# Patient Record
Sex: Female | Born: 1960 | Race: Black or African American | Hispanic: No | Marital: Married | Smoking: Never smoker
Health system: Southern US, Community
[De-identification: ages and names within clinical notes are randomized; demographics above are authoritative.]

## PROBLEM LIST (undated history)

## (undated) DIAGNOSIS — E119 Type 2 diabetes mellitus without complications: Secondary | ICD-10-CM

## (undated) DIAGNOSIS — I10 Essential (primary) hypertension: Secondary | ICD-10-CM

## (undated) DIAGNOSIS — E785 Hyperlipidemia, unspecified: Secondary | ICD-10-CM

## (undated) HISTORY — DX: Essential (primary) hypertension: I10

## (undated) HISTORY — DX: Type 2 diabetes mellitus without complications: E11.9

## (undated) HISTORY — DX: Hyperlipidemia, unspecified: E78.5

---

## 1998-02-01 ENCOUNTER — Emergency Department (HOSPITAL_COMMUNITY): Admission: EM | Admit: 1998-02-01 | Discharge: 1998-02-01 | Payer: Self-pay | Admitting: Emergency Medicine

## 1998-02-02 ENCOUNTER — Emergency Department (HOSPITAL_COMMUNITY): Admission: EM | Admit: 1998-02-02 | Discharge: 1998-02-02 | Payer: Self-pay | Admitting: Emergency Medicine

## 1998-03-12 ENCOUNTER — Encounter: Admission: RE | Admit: 1998-03-12 | Discharge: 1998-03-12 | Payer: Self-pay | Admitting: Family Medicine

## 1998-03-31 ENCOUNTER — Other Ambulatory Visit: Admission: RE | Admit: 1998-03-31 | Discharge: 1998-03-31 | Payer: Self-pay | Admitting: *Deleted

## 1998-03-31 ENCOUNTER — Encounter: Admission: RE | Admit: 1998-03-31 | Discharge: 1998-03-31 | Payer: Self-pay | Admitting: Sports Medicine

## 1998-12-21 ENCOUNTER — Other Ambulatory Visit: Admission: RE | Admit: 1998-12-21 | Discharge: 1998-12-21 | Payer: Self-pay | Admitting: Internal Medicine

## 1999-01-17 ENCOUNTER — Emergency Department (HOSPITAL_COMMUNITY): Admission: EM | Admit: 1999-01-17 | Discharge: 1999-01-17 | Payer: Self-pay | Admitting: Emergency Medicine

## 1999-02-04 ENCOUNTER — Encounter: Payer: Self-pay | Admitting: Emergency Medicine

## 1999-02-04 ENCOUNTER — Emergency Department (HOSPITAL_COMMUNITY): Admission: EM | Admit: 1999-02-04 | Discharge: 1999-02-04 | Payer: Self-pay | Admitting: Emergency Medicine

## 1999-02-17 ENCOUNTER — Encounter: Payer: Self-pay | Admitting: Internal Medicine

## 1999-02-17 ENCOUNTER — Encounter: Admission: RE | Admit: 1999-02-17 | Discharge: 1999-02-17 | Payer: Self-pay | Admitting: Internal Medicine

## 2003-07-30 ENCOUNTER — Emergency Department (HOSPITAL_COMMUNITY): Admission: EM | Admit: 2003-07-30 | Discharge: 2003-07-30 | Payer: Self-pay

## 2006-08-15 ENCOUNTER — Encounter (INDEPENDENT_AMBULATORY_CARE_PROVIDER_SITE_OTHER): Payer: Self-pay | Admitting: Obstetrics and Gynecology

## 2006-08-15 ENCOUNTER — Ambulatory Visit (HOSPITAL_COMMUNITY): Admission: RE | Admit: 2006-08-15 | Discharge: 2006-08-16 | Payer: Self-pay | Admitting: Obstetrics and Gynecology

## 2007-09-11 ENCOUNTER — Encounter: Admission: RE | Admit: 2007-09-11 | Discharge: 2007-09-11 | Payer: Self-pay | Admitting: Obstetrics and Gynecology

## 2007-09-18 ENCOUNTER — Encounter: Admission: RE | Admit: 2007-09-18 | Discharge: 2007-09-18 | Payer: Self-pay | Admitting: Obstetrics and Gynecology

## 2010-06-08 NOTE — H&P (Signed)
[name], [name]                ACCOUNT NO.:  [id number]   MEDICAL RECORD NO.:  [id number]          PATIENT TYPE:  AMB   LOCATION:  SDC                           FACILITY:  [facility]   PHYSICIAN:  Sherron Monday, MD        DATE OF BIRTH:  [birth year]   DATE OF ADMISSION:  08/15/2006  DATE OF DISCHARGE:                              HISTORY & PHYSICAL   ADMISSION DIAGNOSES:  1. Pelvic pressure.  2. Uterine fibroids.   PROCEDURE PLANNED:  Laparoscopic supracervical hysterectomy versus total  abdominal hysterectomy.   HISTORY OF PRESENT ILLNESS:  A 50 year old female with complaints of  pelvic pressure and pain.  She also complains of heavy periods.   PAST MEDICAL HISTORY:  Significant for anemia.   PAST SURGICAL HISTORY:  Not significant.   PAST OB/GYN HISTORY:  She is a G6, P5-0-1-5, with G1 being a female  infant, full-term vaginal delivery.  G2, a preterm vaginal delivery of a  female infant.  He was  delivered 3 months early with no prenatal care.  G3 is a therapeutic abortion.  G4, a term vaginal delivery of a female  infant.  G5, term vaginal delivery of a female infant.  G6, term vaginal  delivery of a female infant and a bilateral tubal ligation.  She has no  history of any abnormal Pap smears, with the most recent of which being  on June 27, 2006, which was within normal limits.  She has no history of  any sexually transmitted diseases.  She is sexually active and  monogamous with a single female partner.  Her periods are heavy and every  2-4 weeks.  She recently had an endometrial biopsy, which revealed no  hyperplasia or malignancy.  She has some dyspareunia similar to her  pressure.  She has intermenstrual bleeding and occasional postcoital  bleeding.   MEDICATIONS:  Iron.   ALLERGIES:  She has no known drug allergies.   SOCIAL HISTORY:  She denies tobacco use.  Occasional alcohol use and  occasional marijuana use.  She is married.   FAMILY HISTORY:  Significant for  hypertension in her mother, sister,  brother and father.  Significant for diabetes in the father, sister and  brother.  Breast cancer in an aunt.  No colon or ovarian cancer.  She  was due when she presented to our office for a mammogram and we have yet  to get the result of that.   PHYSICAL EXAMINATION:  She is 5 feet 6 inches tall, weighs 187 pounds.  Blood pressure was 140/80.  Hemoglobin was 9.  GENERAL:  She is in no apparent distress.  CARDIOVASCULAR:  Regular rate and rhythm.  LUNGS:  Clear to auscultation bilaterally.  ABDOMEN:  Soft, obese, nontender, nondistended, with good bowel sounds.  EXTREMITIES:  Symmetric and nontender.  BREASTS:  D-cup.  No masses, nontender, no distortion.  NECK:  No lymphadenopathy.  Thyroid within normal limits.  HEENT:  Mucous membranes are moist.  BACK:  No costovertebral angle tenderness.  PELVIC:  Normal external female genitalia.  Normal BUS.  Good support.  Cervix and vagina without lesions.  No cervical motion tenderness.  Fourteen to 16-week size uterus with fibroids.  Normal adnexa.  There  were no masses and nontender.   At her initial visit she was diagnosed with both bacterial vaginitis and  Trichomonas.  She was treated with Flagyl.  She came in to discuss  surgery and had a hemoglobin check at that time of 8.6.  She was  encouraged to take her iron twice daily.  We discussed with the patient  the risks, benefits, and alternatives of surgery, including bleeding,  infection, damage to the surrounding organs, as well as laparoscopic  approach versus total abdominal hysterectomy approach.  The patient  voiced understanding and wishes to proceed with a laparoscopic approach  to a hysterectomy, knowing if supracervical she will need to continue  having Pap smears and knowing also that we might have to convert to an  abdominal hysterectomy.  She voices understanding of all this and we  will proceed with surgery on the 22nd.      Sherron Monday, MD  Electronically Signed     JB/MEDQ  D:  08/14/2006  T:  08/14/2006  Job:  [id number]

## 2010-06-08 NOTE — Op Note (Signed)
NAME:  [name], [name]                ACCOUNT NO.:  [id number]   MEDICAL RECORD NO.:  [id number]          PATIENT TYPE:  OIB   LOCATION:  9315                          FACILITY:  [facility]   PHYSICIAN:  Sherron Monday, MD        DATE OF BIRTH:  10/27/1960   DATE OF PROCEDURE:  08/15/2006  DATE OF DISCHARGE:                               OPERATIVE REPORT   PREOPERATIVE DIAGNOSIS:  Uterine fibroids, pelvic pain.   POSTOPERATIVE DIAGNOSIS:  Uterine fibroids, pelvic pain.   PROCEDURE:  Laparoscopic supracervical hysterectomy.   SURGEON:  Sherron Monday, M.D.   ASSISTANT:  Zenaida Niece, M.D.   ANESTHESIA:  General and 13 mL of 0.25% Marcaine.   SPECIMEN:  Morcellated uterus.   ESTIMATED BLOOD LOSS:  900 mL.   IV FLUIDS:  4800 mL.   URINE OUTPUT:  825 mL of clear urine.   COMPLICATIONS:  Bowel serosal injury.   DISPOSITION:  Stable to PACU.   PROCEDURE IN DETAIL:  After informed consent was reviewed with the  patient including the risks, benefits, and alternatives of  the surgical  procedure, she was transported to the operating room and placed on the  table in the supine position.  General anesthesia was induced and found  to be adequate.  She was then placed in the yellow thin stirrups,  prepped and draped in the normal sterile fashion.  A Foley catheter was  sterilely placed.  The gloves were changed and the abdominal portion of  the case was started with a 5 mm infraumbilical incision made. Using the  Veress needle, a pneumoperitoneum was obtained.  The Veress needle  placement was checked with several tests including the hanging drop  test. The pneumoperitoneum was obtained and with direct entry, a 5 mm  port was placed.  A brief pelvic survey was performed revealing a 14 to  16 weeks size obviously fibroid uterus, normal tubes and ovaries  bilaterally.   Attention was then turned to putting in the accessory ports.  Approximately at the level of the umbilicus on the right side,  a 5 mL  infraumbilical port was placed after careful visualization of the  epigastric vessels.  A blunt probe was used to survey the pelvis  further. Attention was then turned to putting the left accessory port in  and a 10 mm port was placed after visualization of the epigastric  vessels. The 5 mm tenaculum was placed in the right port and the uterus  was grasped at the cornua. On the left side, the adnexa was easily taken  down hemostatically, the round ligament and the fallopian tubes and  mesosalpinx.  The uterine arteries were skeletonized and then ligated  and made hemostatic.  The bladder flap was then created.  A similar  procedure was performed on the right side to the level of the uterine  artery.  The cervix was then drilled to excise the uterus.  The  endometrial canal into the cervix was then fulgurated with a harmonic  scalpel.  The uterus was morcellated, weighing 600+ grams.  The  fragments  of uterus were also removed.  In morcellation of the uterus,  the bowel serosa was inadvertently grasped and but found to be only  mildly injured and hemostatic. Interceed was placed at the level of the  cervix. Again, a survey was performed to reveal hemostasis.  The ports  were removed.  The fascia was closed with the 10 mm port and a deep  suture of 0 Vicryl was also placed.  The skin at that incision was  closed with 3-0 Vicryl.  The skin of the additional ports was closed  with Dermabond.  The patient tolerated the procedure well.  Sponge, lap  and needle counts were correct x2 at the end of the procedure.      Sherron Monday, MD  Electronically Signed     JB/MEDQ  D:  08/15/2006  T:  08/15/2006  Job:  [id number]

## 2010-06-08 NOTE — Discharge Summary (Signed)
NAMEBRANTLEIGH, MIFFLIN                ACCOUNT NO.:  [id number]   MEDICAL RECORD NO.:  [id number]          PATIENT TYPE:  OIB   LOCATION:  9315                          [facility]ACILITY:  [facility]   PHYSICIAN:  Sherron Monday, MD        DATE OF BIRTH:  09-18-1960   DATE OF ADMISSION:  08/15/2006  DATE OF DISCHARGE:  08/16/2006                               DISCHARGE SUMMARY   REASON FOR ADMISSION:  She was admitted for observation following a  laparoscopic supracervical hysterectomy with an EBL of 900 mL.   HISTORY OF PRESENT ILLNESS:  This is a 50 year old female with  complaints of pelvic pressure and pain with menorrhagia.   PAST MEDICAL HISTORY:  Significant for anemia.   PAST SURGICAL HISTORY:  Nonsignificant.   PAST OB/GYN HISTORY:  G6, P5, 0, 1, 5 with a recent endometrial biopsy  that was benign as well as a pap smear that was also benign.  No history  of any abnormal pap smear or sexually transmitted diseases.   MEDICATIONS:  Iron.   ALLERGIES:  No known drug allergies.   SOCIAL HISTORY:  Denies tobacco use, occasional alcohol use, occasional  marijuana use.  She is married.   FAMILY HISTORY:  Significant for hypertension as well as diabetes as  well as breast cancer.   PHYSICAL EXAMINATION:  Benign.  On admission she underwent her  hysterectomy on August 15, 2006 with complication but an EBL of 900 mL.  Her postoperative course was relatively uncomplicated.  She was  discharged to home on postoperative day #1.  Her hemoglobin had  decreased from 10.7 preop to 8.9 immediately postop to 7.1 on postop day  #1.  She is advised of this and advised to do iron.  She also showed a  benign exam at this time.  She will be discharged to home with routine  discharge instructions and numbers to call with any questions or  problems.  She will follow up in 2-3 weeks.  She has a prescription for  Motrin and Vicodin and is to continue her iron 1 or 2 times a day.      Sherron Monday, MD  Electronically Signed     JB/MEDQ  D:  08/16/2006  T:  08/16/2006  Job:  [id number]

## 2010-11-08 LAB — DIFFERENTIAL
Basophils Absolute: 0
Basophils Relative: 0
Eosinophils Relative: 3
Monocytes Absolute: 0.4
Neutro Abs: 2.4

## 2010-11-08 LAB — BASIC METABOLIC PANEL
BUN: 7
BUN: <1 — ABNORMAL LOW
CO2: 28
Calcium: 8.9
Chloride: 107
Glucose, Bld: 106 — ABNORMAL HIGH
Glucose, Bld: 98
Potassium: 3.9
Sodium: 137

## 2010-11-08 LAB — CBC
HCT: 21.9 — ABNORMAL LOW
Hemoglobin: 10.7 — ABNORMAL LOW
MCHC: 32.9
MCV: 85
Platelets: 257
Platelets: 330
RDW: 20.1 — ABNORMAL HIGH
RDW: 20.2 — ABNORMAL HIGH

## 2010-11-08 LAB — HEMOGLOBIN AND HEMATOCRIT, BLOOD: Hemoglobin: 8.9 — ABNORMAL LOW

## 2010-11-08 LAB — SAMPLE TO BLOOD BANK

## 2012-01-27 ENCOUNTER — Encounter (HOSPITAL_COMMUNITY): Payer: Self-pay | Admitting: Emergency Medicine

## 2012-01-27 ENCOUNTER — Emergency Department (INDEPENDENT_AMBULATORY_CARE_PROVIDER_SITE_OTHER)
Admission: EM | Admit: 2012-01-27 | Discharge: 2012-01-27 | Disposition: A | Discharge status: Home / Self Care | Payer: Self-pay | Source: Home / Self Care

## 2012-01-27 DIAGNOSIS — IMO0002 Reserved for concepts with insufficient information to code with codable children: Secondary | ICD-10-CM

## 2012-01-27 DIAGNOSIS — S46219A Strain of muscle, fascia and tendon of other parts of biceps, unspecified arm, initial encounter: Secondary | ICD-10-CM

## 2012-01-27 DIAGNOSIS — S39012A Strain of muscle, fascia and tendon of lower back, initial encounter: Secondary | ICD-10-CM

## 2012-01-27 DIAGNOSIS — S43499A Other sprain of unspecified shoulder joint, initial encounter: Secondary | ICD-10-CM

## 2012-01-27 MED ORDER — NAPROXEN 500 MG PO TBEC: 500.0000 mg | DELAYED_RELEASE_TABLET | Freq: Two times a day (BID) | ORAL | Status: DC

## 2012-01-27 MED ORDER — TRAMADOL HCL 50 MG PO TABS: ORAL_TABLET | ORAL | Status: DC
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
## 2012-01-31 NOTE — ED Provider Notes (Signed)
Medical screening examination/treatment/procedure(s) were performed by resident physician or non-physician practitioner and as supervising physician I was immediately available for consultation/collaboration.   Barkley Bruns MD.    Linna Hoff, MD 01/31/12 1355

## 2013-10-01 ENCOUNTER — Other Ambulatory Visit: Payer: Self-pay

## 2013-10-01 DIAGNOSIS — Z1231 Encounter for screening mammogram for malignant neoplasm of breast: Secondary | ICD-10-CM

## 2013-10-15 ENCOUNTER — Ambulatory Visit
Admission: RE | Admit: 2013-10-15 | Discharge: 2013-10-15 | Disposition: A | Discharge status: Home / Self Care | Payer: BC Managed Care – PPO | Source: Ambulatory Visit

## 2013-10-15 DIAGNOSIS — Z1231 Encounter for screening mammogram for malignant neoplasm of breast: Secondary | ICD-10-CM

## 2016-03-25 ENCOUNTER — Other Ambulatory Visit: Payer: Self-pay | Admitting: Nurse Practitioner

## 2016-03-25 DIAGNOSIS — Z1231 Encounter for screening mammogram for malignant neoplasm of breast: Secondary | ICD-10-CM

## 2016-04-19 ENCOUNTER — Ambulatory Visit
Admission: RE | Admit: 2016-04-19 | Discharge: 2016-04-19 | Disposition: A | Discharge status: Home / Self Care | Payer: BLUE CROSS/BLUE SHIELD | Source: Ambulatory Visit | Attending: Nurse Practitioner | Admitting: Nurse Practitioner

## 2016-04-19 DIAGNOSIS — Z1231 Encounter for screening mammogram for malignant neoplasm of breast: Secondary | ICD-10-CM

## 2017-11-20 ENCOUNTER — Other Ambulatory Visit: Payer: Self-pay | Admitting: Internal Medicine

## 2017-11-20 DIAGNOSIS — Z1231 Encounter for screening mammogram for malignant neoplasm of breast: Secondary | ICD-10-CM

## 2017-12-28 ENCOUNTER — Ambulatory Visit
Admission: RE | Admit: 2017-12-28 | Discharge: 2017-12-28 | Disposition: A | Discharge status: Home / Self Care | Payer: 59 | Source: Ambulatory Visit | Attending: Internal Medicine | Admitting: Internal Medicine

## 2017-12-28 DIAGNOSIS — Z1231 Encounter for screening mammogram for malignant neoplasm of breast: Secondary | ICD-10-CM

## 2019-05-01 ENCOUNTER — Other Ambulatory Visit: Payer: Self-pay | Admitting: Internal Medicine

## 2019-05-01 DIAGNOSIS — Z1231 Encounter for screening mammogram for malignant neoplasm of breast: Secondary | ICD-10-CM

## 2019-05-16 ENCOUNTER — Ambulatory Visit
Admission: RE | Admit: 2019-05-16 | Discharge: 2019-05-16 | Disposition: A | Discharge status: Home / Self Care | Payer: 59 | Source: Ambulatory Visit | Attending: Internal Medicine | Admitting: Internal Medicine

## 2019-05-16 ENCOUNTER — Other Ambulatory Visit: Payer: Self-pay

## 2019-05-16 DIAGNOSIS — Z1231 Encounter for screening mammogram for malignant neoplasm of breast: Secondary | ICD-10-CM

## 2019-05-17 ENCOUNTER — Other Ambulatory Visit: Payer: Self-pay | Admitting: Internal Medicine

## 2019-05-17 DIAGNOSIS — R928 Other abnormal and inconclusive findings on diagnostic imaging of breast: Secondary | ICD-10-CM

## 2019-05-29 ENCOUNTER — Other Ambulatory Visit: Payer: Self-pay

## 2019-05-29 ENCOUNTER — Ambulatory Visit
Admission: RE | Admit: 2019-05-29 | Discharge: 2019-05-29 | Disposition: A | Discharge status: Home / Self Care | Payer: 59 | Source: Ambulatory Visit | Attending: Internal Medicine | Admitting: Internal Medicine

## 2019-05-29 ENCOUNTER — Ambulatory Visit: Payer: 59

## 2019-05-29 DIAGNOSIS — R928 Other abnormal and inconclusive findings on diagnostic imaging of breast: Secondary | ICD-10-CM

## 2019-06-01 ENCOUNTER — Ambulatory Visit: Payer: 59 | Attending: Internal Medicine

## 2019-06-01 DIAGNOSIS — Z23 Encounter for immunization: Secondary | ICD-10-CM

## 2019-06-01 NOTE — Progress Notes (Signed)
   Covid-19 Vaccination Clinic  Name:  CLEMMIE BUELNA    MRN: [id number] DOB: June 25, 1960  06/01/2019  Ms. Simons was observed post Covid-19 immunization for 15 minutes without incident. She was provided with Vaccine Information Sheet and instruction to access the V-Safe system.   Ms. Biever was instructed to call 911 with any severe reactions post vaccine: Marland Kitchen Difficulty breathing  . Swelling of face and throat  . A fast heartbeat  . A bad rash all over body  . Dizziness and weakness   Immunizations Administered    Name Date Dose VIS Date Route   Moderna COVID-19 Vaccine 06/01/2019 12:03 PM 0.5 mL 12/2018 Intramuscular   Manufacturer: Moderna   Lot: [id number]   NDC: 89791-504-13

## 2019-07-03 ENCOUNTER — Ambulatory Visit: Payer: 59

## 2020-07-23 ENCOUNTER — Other Ambulatory Visit: Payer: Self-pay | Admitting: Internal Medicine

## 2020-07-23 DIAGNOSIS — Z1231 Encounter for screening mammogram for malignant neoplasm of breast: Secondary | ICD-10-CM

## 2020-09-16 ENCOUNTER — Other Ambulatory Visit: Payer: Self-pay

## 2020-09-16 ENCOUNTER — Ambulatory Visit
Admission: RE | Admit: 2020-09-16 | Discharge: 2020-09-16 | Disposition: A | Discharge status: Home / Self Care | Payer: 59 | Source: Ambulatory Visit | Attending: Internal Medicine | Admitting: Internal Medicine

## 2020-09-16 DIAGNOSIS — Z1231 Encounter for screening mammogram for malignant neoplasm of breast: Secondary | ICD-10-CM

## 2021-12-10 ENCOUNTER — Other Ambulatory Visit: Payer: Self-pay | Admitting: Internal Medicine

## 2021-12-10 DIAGNOSIS — Z1231 Encounter for screening mammogram for malignant neoplasm of breast: Secondary | ICD-10-CM

## 2021-12-15 ENCOUNTER — Ambulatory Visit
Admission: RE | Admit: 2021-12-15 | Discharge: 2021-12-15 | Disposition: A | Discharge status: Home / Self Care | Payer: BC Managed Care – PPO | Source: Ambulatory Visit | Attending: Internal Medicine | Admitting: Internal Medicine

## 2021-12-15 DIAGNOSIS — Z1231 Encounter for screening mammogram for malignant neoplasm of breast: Secondary | ICD-10-CM

## 2022-04-29 IMAGING — MG MM DIGITAL SCREENING BILAT W/ TOMO AND CAD
8 series · 8 of 24 positions shown · non-contrast
Comparison: Previous exam(s).

CLINICAL DATA: Screening.

EXAM:
DIGITAL SCREENING BILATERAL MAMMOGRAM WITH TOMOSYNTHESIS AND CAD
TECHNIQUE: Bilateral screening digital craniocaudal and mediolateral oblique
mammograms were obtained. Bilateral screening digital breast
tomosynthesis was performed. The images were evaluated with
computer-aided detection.

[L CC synth-2D]
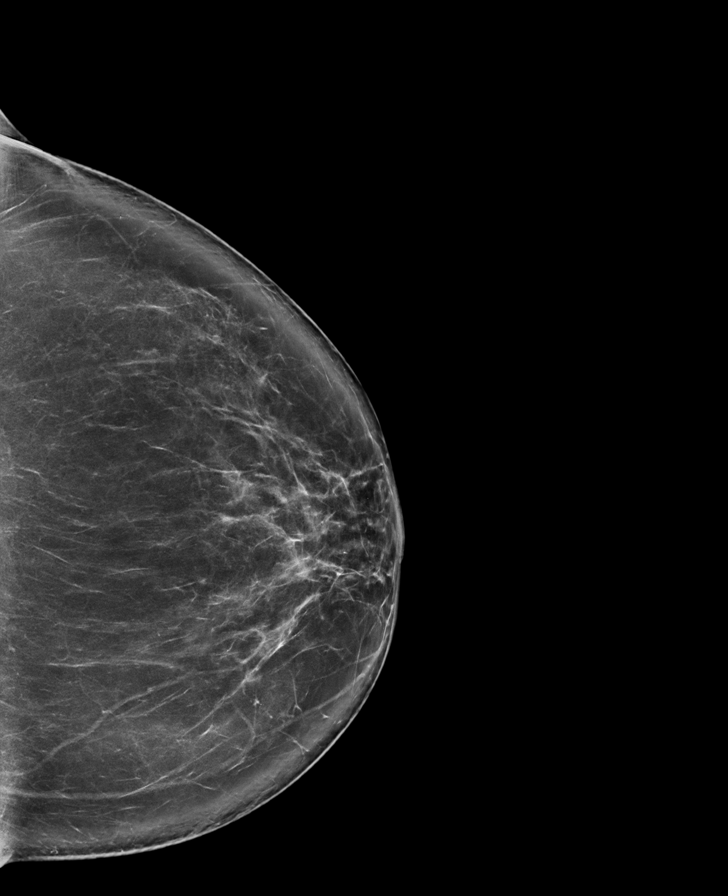

[R MLO synth-2D]
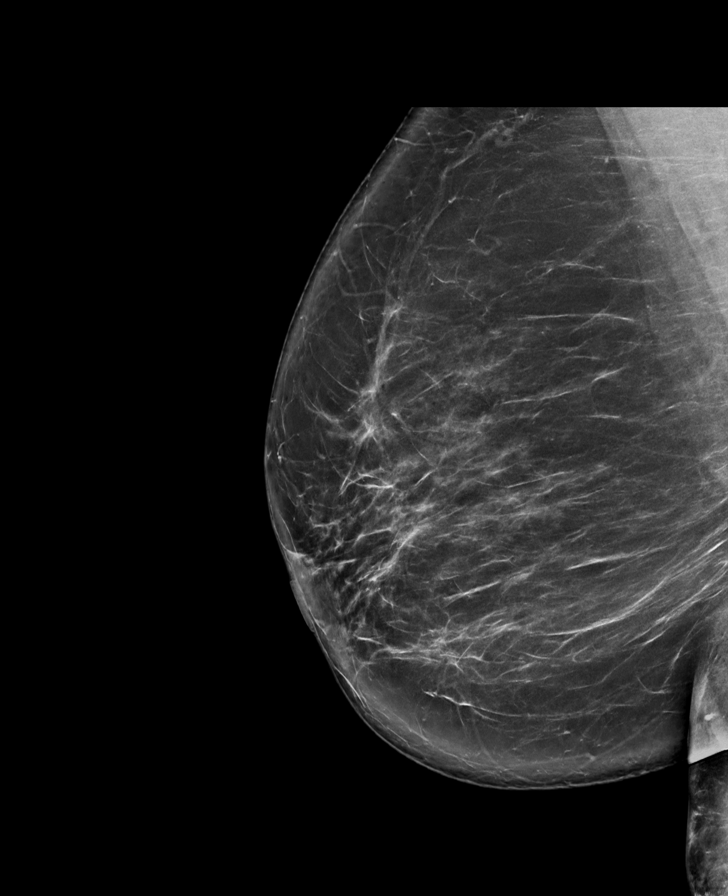

[L MLO synth-2D]
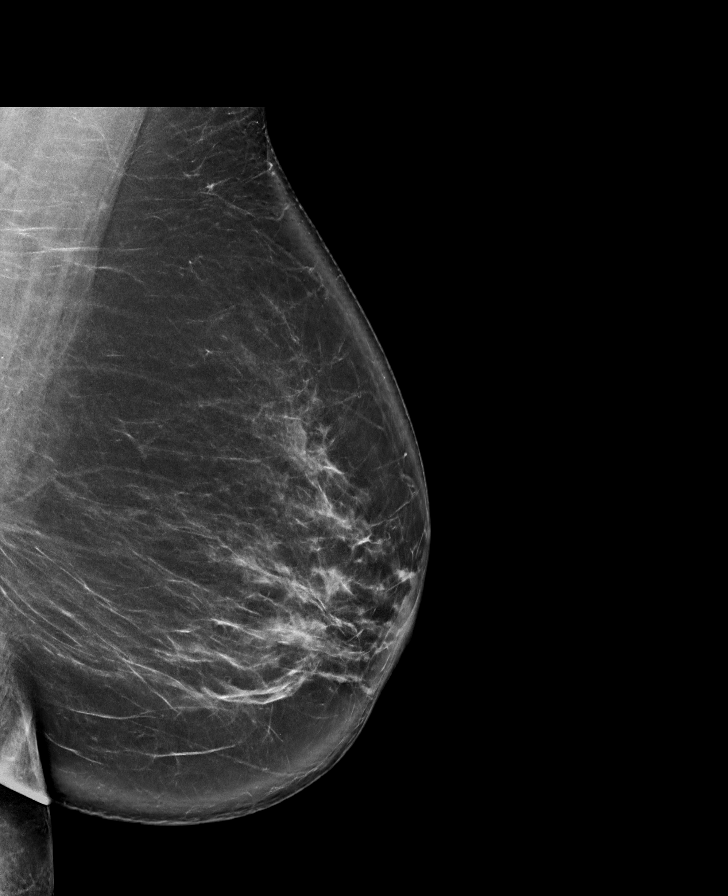

[R CC synth-2D]
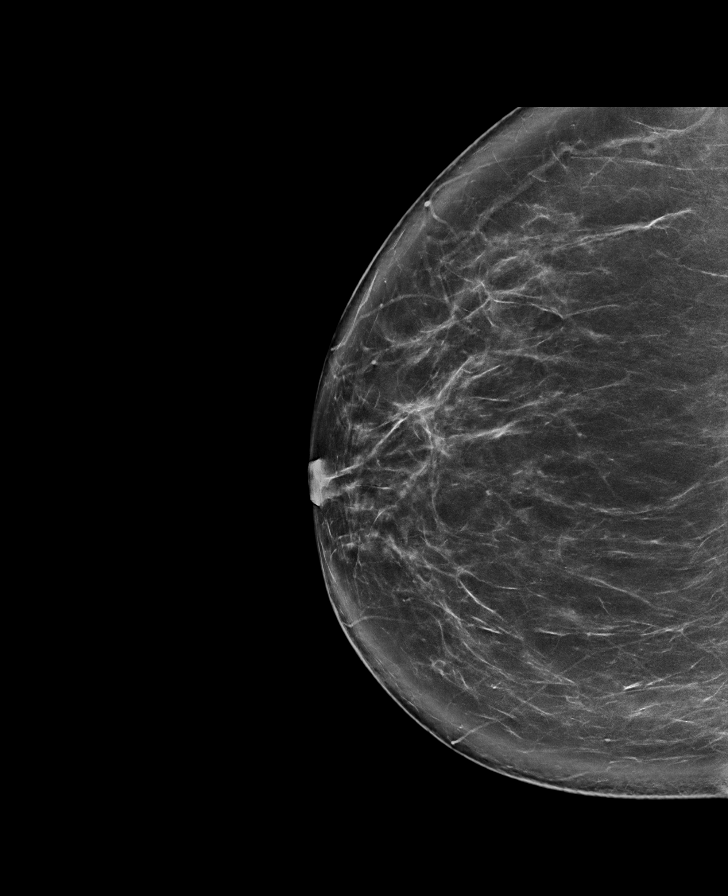

[L MLO tomo · tomo slice 51/101.0]
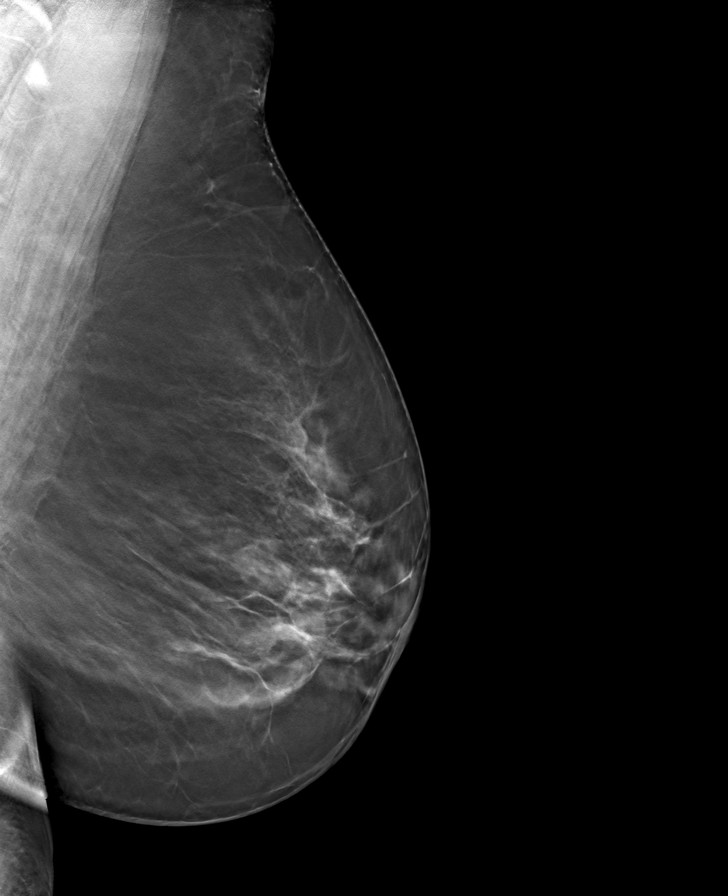

[L CC tomo · tomo slice 49/96.0]
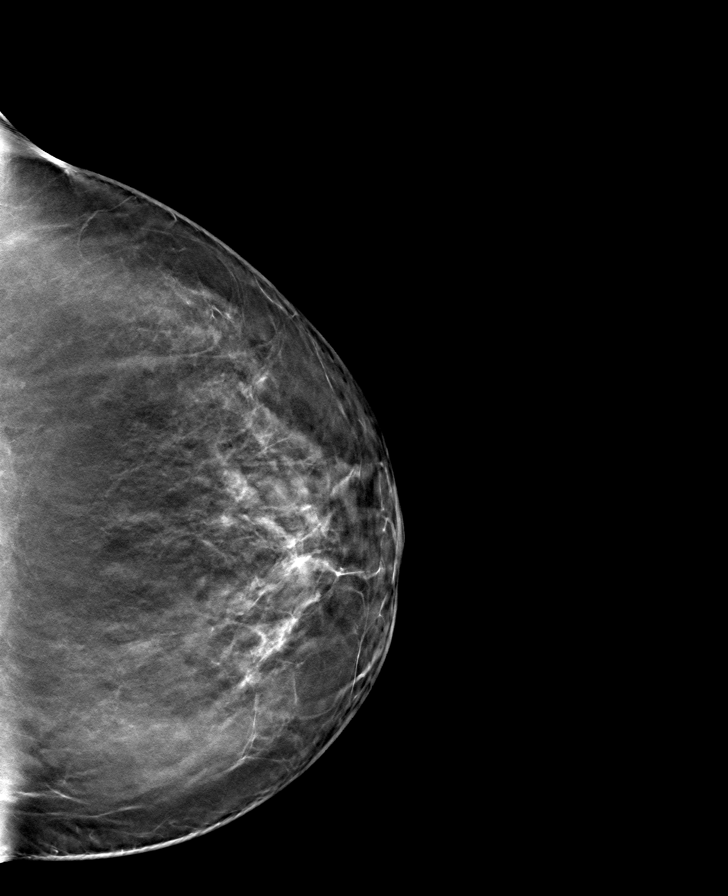

[R CC tomo · tomo slice 50/99.0]
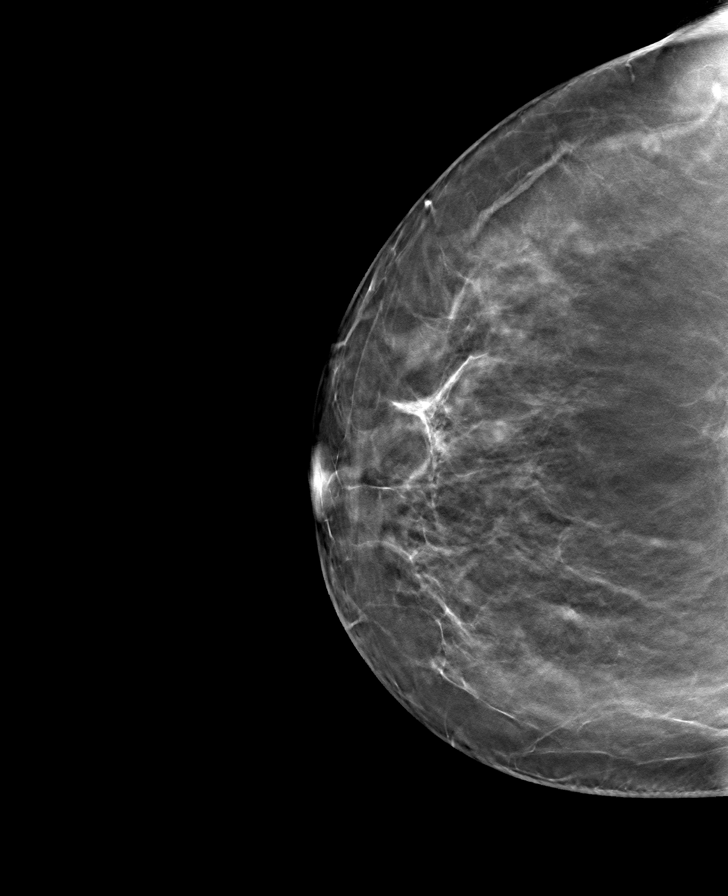

[R MLO tomo · tomo slice 53/104.0]
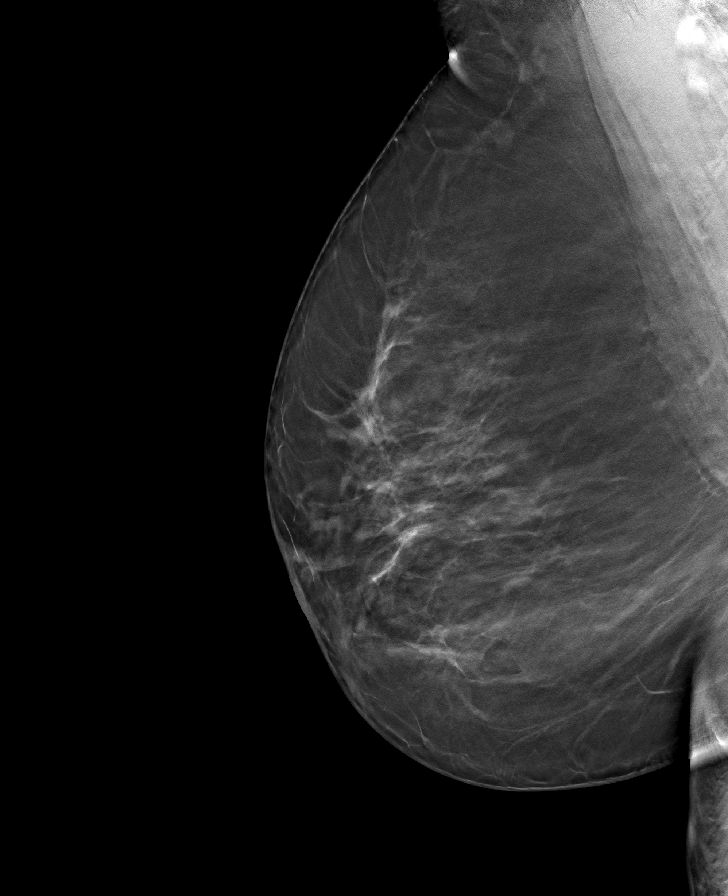

[8 of 24 positions shown; findings below may reference images not displayed]

ACR Breast Density Category b: There are scattered areas of
fibroglandular density.
FINDINGS: There are no findings suspicious for malignancy.
IMPRESSION: No mammographic evidence of malignancy. A result letter of this
screening mammogram will be mailed directly to the patient.

RECOMMENDATION:
Screening mammogram in one year. (Code:51-O-LD2)

BI-RADS CATEGORY  1: Negative.

## 2022-07-18 LAB — HM COLONOSCOPY

## 2022-07-19 NOTE — Progress Notes (Unsigned)
There were no vitals taken for this visit.   Subjective:    Patient ID: [name], female    DOB: 03/12/1960, 62 y.o.   MRN: [id number]  HPI: [name] is a 62 y.o. female  No chief complaint on file.  Establish care: her last physical was ***.  Medical history includes ***.  Family history includes ***.  Health maintenance ***.   Relevant past medical, surgical, family and social history reviewed and updated as indicated. Interim medical history since our last visit reviewed. Allergies and medications reviewed and updated.  Review of Systems  Constitutional: Negative for fever or weight change.  Respiratory: Negative for cough and shortness of breath.   Cardiovascular: Negative for chest pain or palpitations.  Gastrointestinal: Negative for abdominal pain, no bowel changes.  Musculoskeletal: Negative for gait problem or joint swelling.  Skin: Negative for rash.  Neurological: Negative for dizziness or headache.  No other specific complaints in a complete review of systems (except as listed in HPI above).      Objective:    There were no vitals taken for this visit.  Wt Readings from Last 3 Encounters:  No data found for Wt    Physical Exam  Constitutional: Patient appears well-developed and well-nourished. Obese *** No distress.  HEENT: head atraumatic, normocephalic, pupils equal and reactive to light, ears ***, neck supple, throat within normal limits Cardiovascular: Normal rate, regular rhythm and normal heart sounds.  No murmur heard. No BLE edema. Pulmonary/Chest: Effort normal and breath sounds normal. No respiratory distress. Abdominal: Soft.  There is no tenderness. Psychiatric: Patient has a normal mood and affect. behavior is normal. Judgment and thought content normal.   Results for orders placed or performed during the hospital encounter of 08/15/06  Basic metabolic panel  Result Value Ref Range   Sodium 137    Potassium 3.5    Chloride 104     CO2 28    Glucose, Bld 98    BUN 7    Creatinine, Ser 0.58    Calcium 8.9    GFR calc non Af Amer >60    GFR calc Af Amer      >60        The eGFR has been calculated using the MDRD equation. This calculation has not been validated in all clinical  CBC  Result Value Ref Range   WBC 4.3    RBC 3.86 (L)    Hemoglobin 10.7 (L)    HCT 32.4 (L)    MCV 84.1    MCHC 32.9    RDW 20.2 (H)    Platelets 330   Differential  Result Value Ref Range   Neutrophils Relative % 57    Neutro Abs 2.4    Lymphocytes Relative 30    Lymphs Abs 1.3    Monocytes Relative 10    Monocytes Absolute 0.4    Eosinophils Relative 3    Eosinophils Absolute 0.1    Basophils Relative 0    Basophils Absolute 0.0   Hemoglobin and hematocrit, blood  Result Value Ref Range   Hemoglobin 8.9 DELTA CHECK NOTED (L)    HCT 27.2 (L)   Basic metabolic panel  Result Value Ref Range   Sodium 140    Potassium 3.9    Chloride 107    CO2 29    Glucose, Bld 106 (H)    BUN <1 DELTA CHECK NOTED (L)    Creatinine, Ser 0.61    Calcium  8.4    GFR calc non Af Amer >60    GFR calc Af Amer      >60        The eGFR has been calculated using the MDRD equation. This calculation has not been validated in all clinical  CBC  Result Value Ref Range   WBC 6.9    RBC 2.57 (L)    Hemoglobin (LL)     7.1 DELTA CHECK NOTED CRITICAL RESULT CALLED TO, READ BACK BY AND VERIFIED WITH: MOUNCE, A AT 5784 ON 08/16/06 BY TATE J   HCT 21.9 (L)    MCV 85.0    MCHC 32.6    RDW 20.1 (H)    Platelets 257   Sample to Blood Bank  Result Value Ref Range   Blood Bank Specimen SAMPLE AVAILABLE FOR TESTING    Sample Expiration 08/17/2006       Assessment & Plan:   Problem List Items Addressed This Visit   None    Follow up plan: No follow-ups on file.

## 2022-07-21 ENCOUNTER — Encounter: Payer: Self-pay | Admitting: Nurse Practitioner

## 2022-07-21 ENCOUNTER — Ambulatory Visit: Payer: Medicaid Other | Admitting: Nurse Practitioner

## 2022-07-21 VITALS — BP 130/80 | HR 99 | Temp 98.0°F | Resp 16 | Ht 65.5 in | Wt 188.0 lb

## 2022-07-21 DIAGNOSIS — I10 Essential (primary) hypertension: Secondary | ICD-10-CM

## 2022-07-21 DIAGNOSIS — E782 Mixed hyperlipidemia: Secondary | ICD-10-CM | POA: Diagnosis not present

## 2022-07-21 DIAGNOSIS — Z1159 Encounter for screening for other viral diseases: Secondary | ICD-10-CM

## 2022-07-21 DIAGNOSIS — Z7689 Persons encountering health services in other specified circumstances: Secondary | ICD-10-CM

## 2022-07-21 DIAGNOSIS — Z114 Encounter for screening for human immunodeficiency virus [HIV]: Secondary | ICD-10-CM | POA: Diagnosis not present

## 2022-07-21 DIAGNOSIS — E1165 Type 2 diabetes mellitus with hyperglycemia: Secondary | ICD-10-CM

## 2022-07-21 DIAGNOSIS — Z7984 Long term (current) use of oral hypoglycemic drugs: Secondary | ICD-10-CM

## 2022-07-21 LAB — CBC WITH DIFFERENTIAL/PLATELET
Absolute Monocytes: 399 cells/uL (ref 200–950)
Basophils Absolute: 42 cells/uL (ref 0–200)
MCH: 27.5 pg (ref 27.0–33.0)
MPV: 9.8 fL (ref 7.5–12.5)
Neutro Abs: 1663 cells/uL (ref 1500–7800)
RBC: 4 10*6/uL (ref 3.80–5.10)

## 2022-07-21 NOTE — Assessment & Plan Note (Signed)
Continue atorvastatin 20mg daily

## 2022-07-21 NOTE — Assessment & Plan Note (Deleted)
Continue taking metformin 500 mg daily, will get labs today 

## 2022-07-21 NOTE — Assessment & Plan Note (Signed)
Continue taking metformin 500 mg daily, will get labs today

## 2022-07-21 NOTE — Assessment & Plan Note (Signed)
Recommend being consistent with blood pressure medication.  Take lisinopril-hydrochlorothiazide daily

## 2022-07-22 ENCOUNTER — Other Ambulatory Visit: Payer: Self-pay | Admitting: Nurse Practitioner

## 2022-07-22 DIAGNOSIS — D649 Anemia, unspecified: Secondary | ICD-10-CM

## 2022-07-22 LAB — MICROALBUMIN / CREATININE URINE RATIO
Microalb Creat Ratio: 6 mg/g creat (ref <30)
Microalb, Ur: 0.4 mg/dL

## 2022-07-22 LAB — COMPLETE METABOLIC PANEL WITH GFR
AG Ratio: 1.3 (calc) (ref 1.0–2.5)
ALT: 16 U/L (ref 6–29)
BUN: 17 mg/dL (ref 7–25)
CO2: 27 mmol/L (ref 20–32)
Calcium: 9.6 mg/dL (ref 8.6–10.4)
Sodium: 140 mmol/L (ref 135–146)
Total Bilirubin: 0.3 mg/dL (ref 0.2–1.2)
eGFR: 96 mL/min/{1.73_m2} (ref >=60)

## 2022-07-22 LAB — LIPID PANEL
Non-HDL Cholesterol (Calc): 94 mg/dL (calc) (ref <130)
Total CHOL/HDL Ratio: 2.5 (calc) (ref <5.0)

## 2022-07-22 LAB — CBC WITH DIFFERENTIAL/PLATELET
Basophils Relative: 1 %
Eosinophils Absolute: 210 cells/uL (ref 15–500)
Eosinophils Relative: 5 %
HCT: 34.5 % — ABNORMAL LOW (ref 35.0–45.0)
Lymphs Abs: 1886 cells/uL (ref 850–3900)
Monocytes Relative: 9.5 %
Platelets: 329 10*3/uL (ref 140–400)
RDW: 13.3 % (ref 11.0–15.0)
WBC: 4.2 10*3/uL (ref 3.8–10.8)

## 2022-07-22 LAB — HEPATITIS C ANTIBODY: Hepatitis C Ab: NONREACTIVE

## 2022-07-22 LAB — HIV ANTIBODY (ROUTINE TESTING W REFLEX): HIV 1&2 Ab, 4th Generation: NONREACTIVE

## 2022-07-24 LAB — IRON,TIBC AND FERRITIN PANEL
%SAT: 13 % (calc) — ABNORMAL LOW (ref 16–45)
Ferritin: 30 ng/mL (ref 16–232)
Iron: 50 ug/dL (ref 45–160)
TIBC: 385 mcg/dL (calc) (ref 250–450)

## 2022-07-24 LAB — CBC WITH DIFFERENTIAL/PLATELET
Hemoglobin: 11 g/dL — ABNORMAL LOW (ref 11.7–15.5)
MCHC: 31.9 g/dL — ABNORMAL LOW (ref 32.0–36.0)
MCV: 86.3 fL (ref 80.0–100.0)
Neutrophils Relative %: 39.6 %
Total Lymphocyte: 44.9 %

## 2022-07-24 LAB — COMPLETE METABOLIC PANEL WITH GFR
AST: 18 U/L (ref 10–35)
Albumin: 4.3 g/dL (ref 3.6–5.1)
Alkaline phosphatase (APISO): 62 U/L (ref 37–153)
Chloride: 102 mmol/L (ref 98–110)
Creat: 0.72 mg/dL (ref 0.50–1.03)
Globulin: 3.2 g/dL (calc) (ref 1.9–3.7)
Glucose, Bld: 95 mg/dL (ref 65–99)
Potassium: 4.1 mmol/L (ref 3.5–5.3)
Total Protein: 7.5 g/dL (ref 6.1–8.1)

## 2022-07-24 LAB — HEMOGLOBIN A1C
Hgb A1c MFr Bld: 6.5 % of total Hgb — ABNORMAL HIGH (ref <5.7)
Mean Plasma Glucose: 140 mg/dL
eAG (mmol/L): 7.7 mmol/L

## 2022-07-24 LAB — MICROALBUMIN / CREATININE URINE RATIO: Creatinine, Urine: 69 mg/dL (ref 20–275)

## 2022-07-24 LAB — LIPID PANEL
Cholesterol: 157 mg/dL (ref <200)
HDL: 63 mg/dL (ref >=50)
LDL Cholesterol (Calc): 79 mg/dL (calc)
Triglycerides: 73 mg/dL (ref <150)

## 2022-10-20 ENCOUNTER — Encounter: Payer: Medicaid Other | Admitting: Nurse Practitioner

## 2022-10-28 ENCOUNTER — Other Ambulatory Visit: Payer: Self-pay | Admitting: Nurse Practitioner

## 2022-10-28 DIAGNOSIS — Z1211 Encounter for screening for malignant neoplasm of colon: Secondary | ICD-10-CM

## 2022-10-28 DIAGNOSIS — Z1212 Encounter for screening for malignant neoplasm of rectum: Secondary | ICD-10-CM

## 2022-11-01 NOTE — Progress Notes (Signed)
Name: [name]   MRN: [id number]    DOB: 04/16/1960   Date:11/08/2022       Progress Note  Subjective  Chief Complaint  Chief Complaint  Patient presents with   Annual Exam    HPI  Patient presents for annual CPE.  Diet: Regular, tries to eat well balanced Exercise: 3 days 60 minutes, she says that she walks everyday, but cut back during the summer due to the heat Last Eye Exam: October 2023  Flowsheet Row Office Visit from 11/08/2022 in [facility]  AUDIT-C Score 1      Depression: Phq 9 is  negative    11/08/2022   10:07 AM 07/21/2022   10:03 AM  Depression screen PHQ 2/9  Decreased Interest 0 0  Down, Depressed, Hopeless 0 0  PHQ - 2 Score 0 0  Altered sleeping  0  Tired, decreased energy  0  Change in appetite  0  Feeling bad or failure about yourself   0  Trouble concentrating  0  Moving slowly or fidgety/restless  0  Suicidal thoughts  0  PHQ-9 Score  0  Difficult doing work/chores  Not difficult at all   Hypertension: BP Readings from Last 3 Encounters:  11/08/22 138/86  07/21/22 130/80  01/27/12 168/81   Obesity: Wt Readings from Last 3 Encounters:  11/08/22 188 lb 9.6 oz (85.5 kg)  07/21/22 188 lb (85.3 kg)   BMI Readings from Last 3 Encounters:  11/08/22 31.38 kg/m  07/21/22 30.81 kg/m     Vaccines:  HPV: up to at age 26 , ask insurance if age between 90-45  Shingrix: 37-64 yo and ask insurance if covered when patient above 107 yo Pneumonia:  educated and discussed with patient. Flu:  educated and discussed with patient.     Hep C Screening: completed STD testing and prevention (HIV/chl/gon/syphilis): completed Intimate partner violence: negative screen  Sexual History : yes Menstrual History/LMP/Abnormal Bleeding: hysterectomy Discussed importance of follow up if any post-menopausal bleeding: yes  Incontinence Symptoms: negative for symptoms   Breast cancer:  - Last Mammogram: 12/15/2021,  scheduled for this year - BRCA gene screening: none  Osteoporosis Prevention : Discussed high calcium and vitamin D supplementation, weight bearing exercises Bone density :not applicable   Cervical cancer screening: hysterectomy  Skin cancer: Discussed monitoring for atypical lesions  Colorectal cancer: 07/18/22   Lung cancer:  Low Dose CT Chest recommended if Age 75-80 years, 20 pack-year currently smoking OR have quit w/in 15years. Patient does not qualify for screen   ECG: none  Advanced Care Planning: A voluntary discussion about advance care planning including the explanation and discussion of advance directives.  Discussed health care proxy and Living will, and the patient was able to identify a health care proxy as daughter.  Patient does not have a living will and power of attorney of health care   Lipids: Lab Results  Component Value Date   CHOL 157 07/21/2022   Lab Results  Component Value Date   HDL 63 07/21/2022   Lab Results  Component Value Date   LDLCALC 79 07/21/2022   Lab Results  Component Value Date   TRIG 73 07/21/2022   Lab Results  Component Value Date   CHOLHDL 2.5 07/21/2022   No results found for: "LDLDIRECT"  Glucose: Glucose, Bld  Date Value Ref Range Status  07/21/2022 95 65 - 99 mg/dL Final    Comment:    .  Fasting reference interval .   08/16/2006 106 (H)  Final  08/14/2006 98  Final    Patient Active Problem List   Diagnosis Date Noted   Benign essential HTN 07/21/2022   Mixed hyperlipidemia 07/21/2022   Type 2 diabetes mellitus with hyperglycemia, without long-term current use of insulin (HCC) 07/21/2022    No past surgical history on file.  Family History  Problem Relation Age of Onset   Breast cancer Neg Hx     Social History   Socioeconomic History   Marital status: Married    Spouse name: Not on file   Number of children: 5   Years of education: Not on file   Highest education level: Not on file   Occupational History   Not on file  Tobacco Use   Smoking status: Never    Passive exposure: Never   Smokeless tobacco: Never  Vaping Use   Vaping status: Never Used  Substance and Sexual Activity   Alcohol use: Yes    Comment: Socially   Drug use: No   Sexual activity: Yes  Other Topics Concern   Not on file  Social History Narrative   Not on file   Social Determinants of Health   Financial Resource Strain: Low Risk  (11/08/2022)   Overall Financial Resource Strain (CARDIA)    Difficulty of Paying Living Expenses: Not very hard  Food Insecurity: No Food Insecurity (11/08/2022)   Hunger Vital Sign    Worried About Running Out of Food in the Last Year: Never true    Ran Out of Food in the Last Year: Never true  Transportation Needs: No Transportation Needs (11/08/2022)   PRAPARE - Administrator, Civil Service (Medical): No    Lack of Transportation (Non-Medical): No  Physical Activity: Sufficiently Active (11/08/2022)   Exercise Vital Sign    Days of Exercise per Week: 3 days    Minutes of Exercise per Session: 60 min  Stress: No Stress Concern Present (11/08/2022)   [employer] of Occupational Health - Occupational Stress Questionnaire    Feeling of Stress : Only a little  Social Connections: Moderately Integrated (11/08/2022)   Social Connection and Isolation Panel [NHANES]    Frequency of Communication with Friends and Family: More than three times a week    Frequency of Social Gatherings with Friends and Family: More than three times a week    Attends Religious Services: 1 to 4 times per year    Active Member of [employer] or Organizations: No    Attends Banker Meetings: Never    Marital Status: Married  Catering manager Violence: Not At Risk (11/08/2022)   Humiliation, Afraid, Rape, and Kick questionnaire    Fear of Current or Ex-Partner: No    Emotionally Abused: No    Physically Abused: No    Sexually Abused: No     Current  Outpatient Medications:    atorvastatin (LIPITOR) 20 MG tablet, Take 20 mg by mouth daily., Disp: , Rfl:    lisinopril-hydrochlorothiazide (ZESTORETIC) 20-25 MG tablet, Take 1 tablet by mouth daily., Disp: , Rfl:    metFORMIN (GLUCOPHAGE) 500 MG tablet, Take 500 mg by mouth daily with breakfast., Disp: , Rfl:   No Known Allergies   ROS  Constitutional: Negative for fever or weight change.  Respiratory: Negative for cough and shortness of breath.   Cardiovascular: Negative for chest pain or palpitations.  Gastrointestinal: Negative for abdominal pain, no bowel changes.  Musculoskeletal: Negative for gait  problem or joint swelling.  Skin: Negative for rash.  Neurological: Negative for dizziness or headache.  No other specific complaints in a complete review of systems (except as listed in HPI above).   Objective  Vitals:   11/08/22 1006  BP: 138/86  Pulse: 97  Resp: 18  Temp: 97.8 F (36.6 C)  TempSrc: Oral  SpO2: 98%  Weight: 188 lb 9.6 oz (85.5 kg)  Height: 5\' 5"  (1.651 m)    Body mass index is 31.38 kg/m.  Physical Exam Constitutional: Patient appears well-developed and well-nourished. No distress.  HENT: Head: Normocephalic and atraumatic. Ears: B TMs ok, no erythema or effusion; Nose: Nose normal. Mouth/Throat: Oropharynx is clear and moist. No oropharyngeal exudate.  Eyes: Conjunctivae and EOM are normal. Pupils are equal, round, and reactive to light. No scleral icterus.  Neck: Normal range of motion. Neck supple. No JVD present. No thyromegaly present.  Cardiovascular: Normal rate, regular rhythm and normal heart sounds.  No murmur heard. No BLE edema. Pulmonary/Chest: Effort normal and breath sounds normal. No respiratory distress. Abdominal: Soft. Bowel sounds are normal, no distension. There is no tenderness. no masses Breast: no lumps or masses, no nipple discharge or rashes Musculoskeletal: Normal range of motion, no joint effusions. No gross  deformities Neurological: he is alert and oriented to person, place, and time. No cranial nerve deficit. Coordination, balance, strength, speech and gait are normal.  Skin: Skin is warm and dry. No rash noted. No erythema.  Psychiatric: Patient has a normal mood and affect. behavior is normal. Judgment and thought content normal.   No results found for this or any previous visit (from the past 2160 hour(s)).   Fall Risk:    11/08/2022   10:06 AM 07/21/2022   10:03 AM  Fall Risk   Falls in the past year? 0 0  Number falls in past yr: 0 0  Injury with Fall? 0 0  Risk for fall due to : No Fall Risks No Fall Risks  Follow up Falls prevention discussed Falls prevention discussed;Education provided;Falls evaluation completed     Functional Status Survey: Is the patient deaf or have difficulty hearing?: No Does the patient have difficulty seeing, even when wearing glasses/contacts?: No Does the patient have difficulty concentrating, remembering, or making decisions?: No Does the patient have difficulty walking or climbing stairs?: No Does the patient have difficulty dressing or bathing?: No Does the patient have difficulty doing errands alone such as visiting a doctor's office or shopping?: No   Assessment & Plan  1. Annual physical exam - labs recently done -had colon cancer screening, requesting records -declined vaccines -continue to work on lifestyle modification, including eating well balanced diet and increasing physical activity.    -USPSTF grade A and B recommendations reviewed with patient; age-appropriate recommendations, preventive care, screening tests, etc discussed and encouraged; healthy living encouraged; see AVS for patient education given to patient -Discussed importance of 150 minutes of physical activity weekly, eat two servings of fish weekly, eat one serving of tree nuts ( cashews, pistachios, pecans, almonds.Marland Kitchen) every other day, eat 6 servings of fruit/vegetables  daily and drink plenty of water and avoid sweet beverages.   -Reviewed Health Maintenance: Yes.

## 2022-11-03 ENCOUNTER — Other Ambulatory Visit: Payer: Self-pay | Admitting: Nurse Practitioner

## 2022-11-03 DIAGNOSIS — Z1231 Encounter for screening mammogram for malignant neoplasm of breast: Secondary | ICD-10-CM

## 2022-11-08 ENCOUNTER — Other Ambulatory Visit: Payer: Self-pay

## 2022-11-08 ENCOUNTER — Encounter: Payer: Self-pay | Admitting: Nurse Practitioner

## 2022-11-08 ENCOUNTER — Ambulatory Visit: Payer: Medicaid Other | Admitting: Nurse Practitioner

## 2022-11-08 VITALS — BP 138/86 | HR 97 | Temp 97.8°F | Resp 18 | Ht 65.0 in | Wt 188.6 lb

## 2022-11-08 DIAGNOSIS — Z0000 Encounter for general adult medical examination without abnormal findings: Secondary | ICD-10-CM

## 2022-12-19 ENCOUNTER — Ambulatory Visit
Admission: RE | Admit: 2022-12-19 | Discharge: 2022-12-19 | Disposition: A | Discharge status: Home / Self Care | Payer: Medicaid Other | Source: Ambulatory Visit | Attending: Nurse Practitioner | Admitting: Nurse Practitioner

## 2022-12-19 DIAGNOSIS — Z1231 Encounter for screening mammogram for malignant neoplasm of breast: Secondary | ICD-10-CM

## 2023-01-20 ENCOUNTER — Ambulatory Visit: Payer: Medicaid Other | Admitting: Nurse Practitioner

## 2023-02-08 ENCOUNTER — Ambulatory Visit: Payer: Medicaid Other | Admitting: Nurse Practitioner

## 2023-02-16 NOTE — Progress Notes (Signed)
BP 130/72   Pulse 95   Temp 97.9 F (36.6 C)   Resp 18   Ht 5\' 5"  (1.651 m)   Wt 192 lb 14.4 oz (87.5 kg)   SpO2 99%   BMI 32.10 kg/m    Subjective:    Patient ID: [name], female    DOB: 1960/03/24, 63 y.o.   MRN: [id number]  HPI: [name] is a 63 y.o. female  Chief Complaint  Patient presents with   Medical Management of Chronic Issues   Diabetes    Discussed the use of AI scribe software for clinical note transcription with the patient, who gave verbal consent to proceed.  History of Present Illness   The patient, with a history of hypertension, type two diabetes, and hyperlipidemia, has been on atorvastatin 20mg  daily, lisinopril-hydrochlorothiazide 20-25mg  daily, and metformin 500mg  daily. The patient's last A1c was 6.5 and cholesterol was within normal range. The patient admits to not regularly checking her blood sugar but denies any symptoms of hypoglycemia. The patient also has a history of anemia but denies any current symptoms of fatigue. She used to take iron supplements but has since stopped. The patient admits to not consistently taking her prescribed medications, including her cholesterol and blood pressure medications, and metformin. The patient's diet consists of one meal a day, which is usually home-cooked and leans more towards healthy.       11/08/2022   10:07 AM 07/21/2022   10:03 AM  Depression screen PHQ 2/9  Decreased Interest 0 0  Down, Depressed, Hopeless 0 0  PHQ - 2 Score 0 0  Altered sleeping  0  Tired, decreased energy  0  Change in appetite  0  Feeling bad or failure about yourself   0  Trouble concentrating  0  Moving slowly or fidgety/restless  0  Suicidal thoughts  0  PHQ-9 Score  0  Difficult doing work/chores  Not difficult at all    Relevant past medical, surgical, family and social history reviewed and updated as indicated. Interim medical history since our last visit reviewed. Allergies and medications reviewed and  updated.  Review of Systems  Constitutional: Negative for fever or weight change.  Respiratory: Negative for cough and shortness of breath.   Cardiovascular: Negative for chest pain or palpitations.  Gastrointestinal: Negative for abdominal pain, no bowel changes.  Musculoskeletal: Negative for gait problem or joint swelling.  Skin: Negative for rash.  Neurological: Negative for dizziness or headache.  No other specific complaints in a complete review of systems (except as listed in HPI above).      Objective:    BP 130/72   Pulse 95   Temp 97.9 F (36.6 C)   Resp 18   Ht 5\' 5"  (1.651 m)   Wt 192 lb 14.4 oz (87.5 kg)   SpO2 99%   BMI 32.10 kg/m   BP Readings from Last 3 Encounters:  02/17/23 130/72  11/08/22 138/86  07/21/22 130/80     Wt Readings from Last 3 Encounters:  02/17/23 192 lb 14.4 oz (87.5 kg)  11/08/22 188 lb 9.6 oz (85.5 kg)  07/21/22 188 lb (85.3 kg)    Physical Exam  Results for orders placed or performed in visit on 02/17/23  POCT HgB A1C   Collection Time: 02/17/23  1:54 PM  Result Value Ref Range   Hemoglobin A1C 6.4 (A) 4.0 - 5.6 %   HbA1c POC (<> result, manual entry)     HbA1c,  POC (prediabetic range)     HbA1c, POC (controlled diabetic range)     Last CBC Lab Results  Component Value Date   WBC 4.2 07/21/2022   HGB 11.0 (L) 07/21/2022   HCT 34.5 (L) 07/21/2022   MCV 86.3 07/21/2022   MCH 27.5 07/21/2022   RDW 13.3 07/21/2022   PLT 329 07/21/2022   Last metabolic panel Lab Results  Component Value Date   GLUCOSE 95 07/21/2022   NA 140 07/21/2022   K 4.1 07/21/2022   CL 102 07/21/2022   CO2 27 07/21/2022   BUN 17 07/21/2022   CREATININE 0.72 07/21/2022   EGFR 96 07/21/2022   CALCIUM 9.6 07/21/2022   PROT 7.5 07/21/2022   BILITOT 0.3 07/21/2022   AST 18 07/21/2022   ALT 16 07/21/2022   Last lipids Lab Results  Component Value Date   CHOL 157 07/21/2022   HDL 63 07/21/2022   LDLCALC 79 07/21/2022   TRIG 73 07/21/2022    CHOLHDL 2.5 07/21/2022   Last hemoglobin A1c Lab Results  Component Value Date   HGBA1C 6.4 (A) 02/17/2023        Assessment & Plan:   Problem List Items Addressed This Visit       Endocrine   Type 2 diabetes mellitus with hyperglycemia, without long-term current use of insulin (HCC) - Primary   Relevant Orders   POCT HgB A1C (Completed)     Assessment and Plan    Type 2 Diabetes Mellitus Well controlled with A1c of 6.4. Patient reports inconsistent use of Metformin 500mg  daily. -Discontinue Metformin. -Plan to reassess with full panel at next visit.  Hypertension Patient reports inconsistent use of Lisinopril-Hydrochlorothiazide 20-25mg  daily. Blood pressure at visit was 130/72. -Encourage consistent use of Lisinopril-Hydrochlorothiazide.  Hyperlipidemia Patient reports inconsistent use of Atorvastatin 20mg  daily. Last cholesterol was within normal range. -Encourage consistent use of Atorvastatin.  Anemia No current symptoms of fatigue. Patient reports no current use of iron supplements. -No blood work today, plan for full panel at next visit.  Medication adherence Patient reports inconsistent use of all medications. -Encourage consistent use of medications, especially Lisinopril-Hydrochlorothiazide and Atorvastatin.        Follow up plan: Return in about 4 months (around 06/17/2023) for follow up.

## 2023-02-17 ENCOUNTER — Ambulatory Visit: Payer: Medicaid Other | Admitting: Nurse Practitioner

## 2023-02-17 ENCOUNTER — Encounter: Payer: Self-pay | Admitting: Nurse Practitioner

## 2023-02-17 VITALS — BP 130/72 | HR 95 | Temp 97.9°F | Resp 18 | Ht 65.0 in | Wt 192.9 lb

## 2023-02-17 DIAGNOSIS — I10 Essential (primary) hypertension: Secondary | ICD-10-CM

## 2023-02-17 DIAGNOSIS — E1165 Type 2 diabetes mellitus with hyperglycemia: Secondary | ICD-10-CM

## 2023-02-17 DIAGNOSIS — E785 Hyperlipidemia, unspecified: Secondary | ICD-10-CM

## 2023-02-17 DIAGNOSIS — Z7984 Long term (current) use of oral hypoglycemic drugs: Secondary | ICD-10-CM | POA: Diagnosis not present

## 2023-02-17 LAB — POCT GLYCOSYLATED HEMOGLOBIN (HGB A1C): Hemoglobin A1C: 6.4 % — AB (ref 4.0–5.6)

## 2023-06-15 NOTE — Progress Notes (Signed)
 BP (!) 152/96 (BP Location: Left Arm, Patient Position: Sitting, Cuff Size: Normal)   Pulse 96   Temp 98 F (36.7 C) (Oral)   Ht 5\' 5"  (1.651 m)   Wt 194 lb 1.6 oz (88 kg)   SpO2 95%   BMI 32.30 kg/m    Subjective:    Patient ID: [name], female    DOB: 09-Oct-1960, 62 y.o.   MRN: [id number]  HPI: [name] is a 63 y.o. female  Chief Complaint  Patient presents with   Medical managment    Follow up    Discussed the use of AI scribe software for clinical note transcription with the patient, who gave verbal consent to proceed.  History of Present Illness [name] is a 63 year old female with hypertension, type two diabetes, and hyperlipidemia who presents for a four month follow-up.  She has not taken her blood pressure medication today and does not routinely take her prescribed lisinopril-hydrochlorothiazide 20-25 mg daily.  Her last hemoglobin A1c was Madison. She is not currently taking metformin 500 mg daily but feels she has been managing her diabetes well without it. She occasionally checks her blood sugar at home, with readings typically not exceeding 150 mg/dL, often around 742 mg/dL.  For hyperlipidemia, she is prescribed atorvastatin 20 mg daily but is not currently taking it. Her last cholesterol check was almost a year ago, and at that time, her levels were reported as okay. She is due for a recheck of her cholesterol levels.  She is up to date on her microalbumin urine and foot exams but needs to schedule her diabetic eye exam.         06/16/2023   10:56 AM 11/08/2022   10:07 AM 07/21/2022   10:03 AM  Depression screen PHQ 2/9  Decreased Interest 0 0 0  Down, Depressed, Hopeless 0 0 0  PHQ - 2 Score 0 0 0  Altered sleeping 0  0  Tired, decreased energy 0  0  Change in appetite 0  0  Feeling bad or failure about yourself  0  0  Trouble concentrating 0  0  Moving slowly or fidgety/restless 0  0  Suicidal thoughts 0  0  PHQ-9 Score 0  0   Difficult doing work/chores Not difficult at all  Not difficult at all    Relevant past medical, surgical, family and social history reviewed and updated as indicated. Interim medical history since our last visit reviewed. Allergies and medications reviewed and updated.  Review of Systems  Constitutional: Negative for fever or weight change.  Respiratory: Negative for cough and shortness of breath.   Cardiovascular: Negative for chest pain or palpitations.  Gastrointestinal: Negative for abdominal pain, no bowel changes.  Musculoskeletal: Negative for gait problem or joint swelling.  Skin: Negative for rash.  Neurological: Negative for dizziness or headache.  No other specific complaints in a complete review of systems (except as listed in HPI above).      Objective:      BP (!) 152/96 (BP Location: Left Arm, Patient Position: Sitting, Cuff Size: Normal)   Pulse 96   Temp 98 F (36.7 C) (Oral)   Ht 5\' 5"  (1.651 m)   Wt 194 lb 1.6 oz (88 kg)   SpO2 95%   BMI 32.30 kg/m    Wt Readings from Last 3 Encounters:  06/16/23 194 lb 1.6 oz (88 kg)  02/17/23 192 lb 14.4 oz (87.5 kg)  11/08/22  188 lb 9.6 oz (85.5 kg)    Physical Exam Vitals reviewed.  Constitutional:      Appearance: Normal appearance.  HENT:     Head: Normocephalic.  Cardiovascular:     Rate and Rhythm: Normal rate and regular rhythm.  Pulmonary:     Effort: Pulmonary effort is normal.     Breath sounds: Normal breath sounds.  Musculoskeletal:        General: Normal range of motion.  Skin:    General: Skin is warm and dry.  Neurological:     General: No focal deficit present.     Mental Status: She is alert and oriented to person, place, and time. Mental status is at baseline.  Psychiatric:        Mood and Affect: Mood normal.        Behavior: Behavior normal.        Thought Content: Thought content normal.        Judgment: Judgment normal.    Physical Exam    Results for orders placed or  performed in visit on 02/17/23  POCT HgB A1C   Collection Time: 02/17/23  1:54 PM  Result Value Ref Range   Hemoglobin A1C Madison (A) 4.0 - 5.6 %   HbA1c POC (<> result, manual entry)     HbA1c, POC (prediabetic range)     HbA1c, POC (controlled diabetic range)            Assessment & Plan:   Problem List Items Addressed This Visit       Cardiovascular and Mediastinum   Benign essential HTN - Primary   Relevant Medications   lisinopril-hydrochlorothiazide (ZESTORETIC) 20-25 MG tablet   Other Relevant Orders   CBC with Differential/Platelet   Comprehensive metabolic panel with GFR     Endocrine   Type 2 diabetes mellitus with hyperglycemia, without long-term current use of insulin (HCC)   Relevant Medications   lisinopril-hydrochlorothiazide (ZESTORETIC) 20-25 MG tablet   Other Relevant Orders   Comprehensive metabolic panel with GFR   Hemoglobin A1c     Other   Mixed hyperlipidemia   Relevant Medications   lisinopril-hydrochlorothiazide (ZESTORETIC) 20-25 MG tablet   Other Relevant Orders   Comprehensive metabolic panel with GFR   Lipid panel     Assessment and Plan Assessment & Plan Hypertension Blood pressure elevated today, likely due to non-adherence to medication. Emphasized the importance of controlling blood pressure to prevent complications such as heart failure and kidney damage. Explained that hypertension is often asymptomatic, hence the term 'silent killer'. - Encourage adherence to antihypertensive medication. - Recheck blood pressure today for documentation. - Send prescription for antihypertensive medication to pharmacy.  Type 2 diabetes mellitus Last A1c was Madison, indicating good control. Not currently taking metformin but reports blood glucose levels at home are generally below 150 mg/dL. - Recheck A1c today. - Monitor blood glucose levels at home.  Hyperlipidemia Cholesterol levels were previously within acceptable range. Not currently taking  atorvastatin. Plan to reassess cholesterol levels to determine if medication is needed. - Recheck cholesterol levels today.        Follow up plan: Return in about 4 months (around 10/17/2023) for follow up.

## 2023-06-16 ENCOUNTER — Ambulatory Visit: Payer: Self-pay | Admitting: Nurse Practitioner

## 2023-06-16 ENCOUNTER — Encounter: Payer: Self-pay | Admitting: Nurse Practitioner

## 2023-06-16 VITALS — BP 152/96 | HR 96 | Temp 98.0°F | Ht 65.0 in | Wt 194.1 lb

## 2023-06-16 DIAGNOSIS — I10 Essential (primary) hypertension: Secondary | ICD-10-CM | POA: Diagnosis not present

## 2023-06-16 DIAGNOSIS — E782 Mixed hyperlipidemia: Secondary | ICD-10-CM

## 2023-06-16 DIAGNOSIS — E1165 Type 2 diabetes mellitus with hyperglycemia: Secondary | ICD-10-CM | POA: Diagnosis not present

## 2023-06-16 MED ORDER — LISINOPRIL-HYDROCHLOROTHIAZIDE 20-25 MG PO TABS: 1.0000 | ORAL_TABLET | Freq: Every day | ORAL | 1 refills | Status: AC

## 2023-06-17 LAB — COMPREHENSIVE METABOLIC PANEL WITH GFR
AG Ratio: 1.2 (calc) (ref 1.0–2.5)
ALT: 11 U/L (ref 6–29)
AST: 16 U/L (ref 10–35)
Albumin: 4.4 g/dL (ref 3.6–5.1)
Alkaline phosphatase (APISO): 75 U/L (ref 37–153)
BUN: 12 mg/dL (ref 7–25)
CO2: 30 mmol/L (ref 20–32)
Calcium: 9.7 mg/dL (ref 8.6–10.4)
Chloride: 100 mmol/L (ref 98–110)
Creat: 0.73 mg/dL (ref 0.50–1.05)
Globulin: 3.6 g/dL (ref 1.9–3.7)
Glucose, Bld: 98 mg/dL (ref 65–99)
Potassium: 4.3 mmol/L (ref 3.5–5.3)
Sodium: 139 mmol/L (ref 135–146)
Total Bilirubin: 0.3 mg/dL (ref 0.2–1.2)
Total Protein: 8 g/dL (ref 6.1–8.1)
eGFR: 94 mL/min/{1.73_m2} (ref >=60)

## 2023-06-17 LAB — LIPID PANEL
Cholesterol: 218 mg/dL — ABNORMAL HIGH (ref <200)
HDL: 72 mg/dL (ref >=50)
LDL Cholesterol (Calc): 127 mg/dL — ABNORMAL HIGH
Non-HDL Cholesterol (Calc): 146 mg/dL — ABNORMAL HIGH (ref <130)
Total CHOL/HDL Ratio: 3 (calc) (ref <5.0)
Triglycerides: 90 mg/dL (ref <150)

## 2023-06-17 LAB — CBC WITH DIFFERENTIAL/PLATELET
Absolute Lymphocytes: 2167 {cells}/uL (ref 850–3900)
Absolute Monocytes: 429 {cells}/uL (ref 200–950)
Basophils Absolute: 39 {cells}/uL (ref 0–200)
Basophils Relative: 0.7 %
Eosinophils Absolute: 270 {cells}/uL (ref 15–500)
Eosinophils Relative: 4.9 %
HCT: 34.5 % — ABNORMAL LOW (ref 35.0–45.0)
Hemoglobin: 10.9 g/dL — ABNORMAL LOW (ref 11.7–15.5)
MCH: 26.7 pg — ABNORMAL LOW (ref 27.0–33.0)
MCHC: 31.6 g/dL — ABNORMAL LOW (ref 32.0–36.0)
MCV: 84.6 fL (ref 80.0–100.0)
MPV: 9.3 fL (ref 7.5–12.5)
Monocytes Relative: 7.8 %
Neutro Abs: 2596 {cells}/uL (ref 1500–7800)
Neutrophils Relative %: 47.2 %
Platelets: 421 10*3/uL — ABNORMAL HIGH (ref 140–400)
RBC: 4.08 10*6/uL (ref 3.80–5.10)
RDW: 13.2 % (ref 11.0–15.0)
Total Lymphocyte: 39.4 %
WBC: 5.5 10*3/uL (ref 3.8–10.8)

## 2023-06-17 LAB — HEMOGLOBIN A1C
Hgb A1c MFr Bld: 6.8 % — ABNORMAL HIGH (ref <5.7)
Mean Plasma Glucose: 148 mg/dL
eAG (mmol/L): 8.2 mmol/L

## 2023-06-20 ENCOUNTER — Ambulatory Visit: Payer: Self-pay | Admitting: Nurse Practitioner

## 2023-06-21 ENCOUNTER — Other Ambulatory Visit: Payer: Self-pay | Admitting: Nurse Practitioner

## 2023-06-21 DIAGNOSIS — E1165 Type 2 diabetes mellitus with hyperglycemia: Secondary | ICD-10-CM

## 2023-06-21 MED ORDER — METFORMIN HCL ER 500 MG PO TB24: 500.0000 mg | ORAL_TABLET | Freq: Every day | ORAL | 1 refills | Status: AC

## 2023-06-28 ENCOUNTER — Telehealth: Payer: Self-pay

## 2023-06-28 ENCOUNTER — Ambulatory Visit: Payer: Self-pay

## 2023-06-28 NOTE — Telephone Encounter (Signed)
 Copied from CRM [id number]. Topic: Clinical - Lab/Test Results >> Jun 28, 2023 12:23 PM [name] wrote: Reason for CRM: The patient would like to be contacted by a member of clinical staff to review their labs from 06/16/23 in depth  Please contact further if/when possible

## 2023-06-28 NOTE — Telephone Encounter (Signed)
 FYI Only or Action Required?: FYI only for provider  Patient was last seen in primary care on 06/16/2023 by Quinton Buckler, FNP. Called Nurse Triage reporting dizziness. Symptoms began several days ago. Interventions attempted: Other: patient has to sit down when dizziness episodes occur - lasting 5 min, multiple times a day. Symptoms are: unchanged.  Triage Disposition: See Physician Within 24 Hours  Patient/caregiver understands and will follow disposition?: Yes       Reason for Disposition  [1] MODERATE dizziness (e.g., interferes with normal activities) AND [2] has NOT been evaluated by doctor (or NP/PA) for this  (Exception: Dizziness caused by heat exposure, sudden standing, or poor fluid intake.)  Answer Assessment - Initial Assessment Questions 1. DESCRIPTION: "Describe your dizziness."     Lightheaded  2. LIGHTHEADED: "Do you feel lightheaded?" (e.g., somewhat faint, woozy, weak upon standing)     Patient states it is random bouts of dizziness lasting about 5 minutes, approximately a few times a day 3. VERTIGO: "Do you feel like either you or the room is spinning or tilting?" (i.e. vertigo)     No 4. SEVERITY: "How bad is it?"  "Do you feel like you are going to faint?" "Can you stand and walk?"   - MILD: Feels slightly dizzy, but walking normally.   - MODERATE: Feels unsteady when walking, but not falling; interferes with normal activities (e.g., school, work).   - SEVERE: Unable to walk without falling, or requires assistance to walk without falling; feels like passing out now.      Patient states the dizzy spell cause her to sit down 5. ONSET:  "When did the dizziness begin?"     3-4 days 6. AGGRAVATING FACTORS: "Does anything make it worse?" (e.g., standing, change in head position)     No specific worsening or better 7. HEART RATE: "Can you tell me your heart rate?" "How many beats in 15 seconds?"  (Note: not all patients can do this)       HR has been good and  8.  CAUSE: "What do you think is causing the dizziness?"     Patient is uncertain, she initially called asking if it was related to recent labs. Hydration and appetite are normal.  10. OTHER SYMPTOMS: "Do you have any other symptoms?" (e.g., fever, chest pain, vomiting, diarrhea, bleeding)       No   Denies: Fainting, chest pain, bleeding, back pain, numbness/weakness of arms or legs, headaches  Protocols used: Dizziness - Lightheadedness-A-AH

## 2023-06-29 ENCOUNTER — Encounter: Payer: Self-pay | Admitting: Nurse Practitioner

## 2023-06-29 ENCOUNTER — Ambulatory Visit: Admitting: Nurse Practitioner

## 2023-06-29 VITALS — BP 164/92 | HR 97 | Temp 98.1°F | Ht 65.0 in | Wt 198.0 lb

## 2023-06-29 DIAGNOSIS — G8929 Other chronic pain: Secondary | ICD-10-CM | POA: Diagnosis not present

## 2023-06-29 DIAGNOSIS — R42 Dizziness and giddiness: Secondary | ICD-10-CM | POA: Diagnosis not present

## 2023-06-29 DIAGNOSIS — M25511 Pain in right shoulder: Secondary | ICD-10-CM | POA: Diagnosis not present

## 2023-06-29 DIAGNOSIS — I10 Essential (primary) hypertension: Secondary | ICD-10-CM

## 2023-06-29 NOTE — Progress Notes (Signed)
 BP (!) 164/92 (BP Location: Right Arm, Patient Position: Sitting, Cuff Size: Normal)   Pulse 97   Temp 98.1 F (36.7 C) (Oral)   Ht 5\' 5"  (1.651 m)   Wt 198 lb (89.8 kg)   SpO2 99%   BMI 32.95 kg/m    Subjective:    Patient ID: [name], female    DOB: 1960/06/21, 63 y.o.   MRN: [id number]  HPI: [name] is a 63 y.o. female  Chief Complaint  Patient presents with   Dizziness    Onset 1week, comes and goes about 3x daily   Shoulder Pain    R shoulder, onset over 1 year. Can't sleep on that side, pain with raising arm     Discussed the use of AI scribe software for clinical note transcription with the patient, who gave verbal consent to proceed.  History of Present Illness [name] is a 63 year old female with hypertension who presents with dizziness and right shoulder pain.  She has been experiencing intermittent dizziness for over a week, described as feeling 'lightheaded' with occasional near-falls. The dizziness occurs unpredictably, without specific triggers or preceding symptoms. No chest pain or shortness of breath. She has a history of similar dizzy spells associated with anemia. Recently, she has not been drinking much water and has been eating more ice. She denies any worsening dizziness with turning her head.  She denies any chest pain, shortness of breath, headaches, weakness or numbness.   She has had right shoulder pain for about a year, which prevents her from sleeping on that side and occurs when she raises her arm. She takes Aleve  at night to manage the pain. There is no history of trauma to the shoulder. Her brother has had similar issues requiring surgery, raising her concern about a possible rotator cuff problem.  Her blood pressure was elevated at 164/92 during the visit. She is currently taking lisinopril -hydrochlorothiazide  20-25 mg daily for hypertension and missed a dose recently due to being at a graduation ceremony.    Orthostatic  Vitals for the past 48 hrs (Last 6 readings):  Patient Position BP Pulse BP Location Cuff Size Patient Position (if appropriate) BP- Standing at 0 minutes Pulse- Standing at 0 minutes BP- Sitting Pulse- Sitting BP- Lying Pulse- Lying  06/29/23 1001 Sitting (!) 164/92 97 Right Arm Normal -- -- -- -- -- -- --  06/29/23 1025 -- -- -- -- -- Orthostatic Vitals 126/72 93 142/74 90 146/88 93        06/29/2023   10:04 AM 06/16/2023   10:56 AM 11/08/2022   10:07 AM  Depression screen PHQ 2/9  Decreased Interest 0 0 0  Down, Depressed, Hopeless 0 0 0  PHQ - 2 Score 0 0 0  Altered sleeping 0 0   Tired, decreased energy 0 0   Change in appetite 0 0   Feeling bad or failure about yourself  0 0   Trouble concentrating 0 0   Moving slowly or fidgety/restless 0 0   Suicidal thoughts 0 0   PHQ-9 Score 0 0   Difficult doing work/chores Not difficult at all Not difficult at all     Relevant past medical, surgical, family and social history reviewed and updated as indicated. Interim medical history since our last visit reviewed. Allergies and medications reviewed and updated.  Review of Systems  Ten systems reviewed and is negative except as mentioned in HPI      Objective:  BP (!) 164/92 (BP Location: Right Arm, Patient Position: Sitting, Cuff Size: Normal)   Pulse 97   Temp 98.1 F (36.7 C) (Oral)   Ht 5\' 5"  (1.651 m)   Wt 198 lb (89.8 kg)   SpO2 99%   BMI 32.95 kg/m    Wt Readings from Last 3 Encounters:  06/29/23 198 lb (89.8 kg)  06/16/23 194 lb 1.6 oz (88 kg)  02/17/23 192 lb 14.4 oz (87.5 kg)    Physical Exam Vitals reviewed.  Constitutional:      Appearance: Normal appearance.  HENT:     Head: Normocephalic.  Cardiovascular:     Rate and Rhythm: Normal rate and regular rhythm.  Pulmonary:     Effort: Pulmonary effort is normal.     Breath sounds: Normal breath sounds.  Musculoskeletal:        General: Normal range of motion.  Skin:    General: Skin is warm and  dry.  Neurological:     General: No focal deficit present.     Mental Status: She is alert and oriented to person, place, and time. Mental status is at baseline.     Cranial Nerves: Cranial nerves 2-12 are intact.     Sensory: Sensation is intact.     Motor: Motor function is intact.     Coordination: Coordination is intact.     Gait: Gait is intact.  Psychiatric:        Mood and Affect: Mood normal.        Behavior: Behavior normal.        Thought Content: Thought content normal.        Judgment: Judgment normal.        Results for orders placed or performed in visit on 06/16/23  CBC with Differential/Platelet   Collection Time: 06/16/23 11:34 AM  Result Value Ref Range   WBC 5.5 3.8 - 10.8 Thousand/uL   RBC 4.08 3.80 - 5.10 Million/uL   Hemoglobin 10.9 (L) 11.7 - 15.5 g/dL   HCT 84.1 (L) 32.4 - 40.1 %   MCV 84.6 80.0 - 100.0 fL   MCH 26.7 (L) 27.0 - 33.0 pg   MCHC 31.6 (L) 32.0 - 36.0 g/dL   RDW 02.7 25.3 - 66.4 %   Platelets 421 (H) 140 - 400 Thousand/uL   MPV 9.3 7.5 - 12.5 fL   Neutro Abs 2,596 1,500 - 7,800 cells/uL   Absolute Lymphocytes 2,167 850 - 3,900 cells/uL   Absolute Monocytes 429 200 - 950 cells/uL   Eosinophils Absolute 270 15 - 500 cells/uL   Basophils Absolute 39 0 - 200 cells/uL   Neutrophils Relative % 47.2 %   Total Lymphocyte 39.4 %   Monocytes Relative 7.8 %   Eosinophils Relative 4.9 %   Basophils Relative 0.7 %  Comprehensive metabolic panel with GFR   Collection Time: 06/16/23 11:34 AM  Result Value Ref Range   Glucose, Bld 98 65 - 99 mg/dL   BUN 12 7 - 25 mg/dL   Creat 4.03 4.74 - 2.59 mg/dL   eGFR 94 > OR = 60 DG/LOV/5.64P3   BUN/Creatinine Ratio SEE NOTE: 6 - 22 (calc)   Sodium 139 135 - 146 mmol/L   Potassium 4.3 3.5 - 5.3 mmol/L   Chloride 100 98 - 110 mmol/L   CO2 30 20 - 32 mmol/L   Calcium 9.7 8.6 - 10.4 mg/dL   Total Protein 8.0 6.1 - 8.1 g/dL   Albumin 4.4 3.6 - 5.1 g/dL  Globulin 3.6 1.9 - 3.7 g/dL (calc)   AG Ratio 1.2  1.0 - 2.5 (calc)   Total Bilirubin 0.3 0.2 - 1.2 mg/dL   Alkaline phosphatase (APISO) 75 37 - 153 U/L   AST 16 10 - 35 U/L   ALT 11 6 - 29 U/L  Lipid panel   Collection Time: 06/16/23 11:34 AM  Result Value Ref Range   Cholesterol 218 (H) <200 mg/dL   HDL 72 > OR = 50 mg/dL   Triglycerides 90 <409 mg/dL   LDL Cholesterol (Calc) 127 (H) mg/dL (calc)   Total CHOL/HDL Ratio 3.0 <5.0 (calc)   Non-HDL Cholesterol (Calc) 146 (H) <130 mg/dL (calc)  Hemoglobin W1X   Collection Time: 06/16/23 11:34 AM  Result Value Ref Range   Hgb A1c MFr Bld 6.8 (H) <5.7 %   Mean Plasma Glucose 148 mg/dL   eAG (mmol/L) 8.2 mmol/L          Assessment & Plan:   Problem List Items Addressed This Visit       Cardiovascular and Mediastinum   Benign essential HTN   Other Visit Diagnoses       Dizziness    -  Primary   Relevant Orders   CBC with Differential/Platelet   Iron, TIBC and Ferritin Panel     Chronic right shoulder pain       Relevant Orders   Ambulatory referral to Orthopedic Surgery        Assessment and Plan Assessment & Plan Dizziness Intermittent dizziness for over a week, described as lightheadedness with occasional near falls. No clear triggers identified. Differential includes orthostatic hypotension, anemia, and medication side effects. Orthostatic vital signs showed a small dip on standing. Similar episodes associated with anemia. Reports inadequate hydration and increased ice consumption, suggesting iron deficiency. - Recheck CBC and iron panel - Encourage increased hydration  Anemia Slight anemia on recent labs, consistent with previous results. Increased ice consumption suggests iron deficiency. Associates previous dizzy spells with anemia. - Recheck CBC and iron panel - Discuss potential need for iron supplementation based on results  Hypertension Elevated blood pressure at 164/92 mmHg. On lisinopril -hydrochlorothiazide  20-25 mg daily. Possible contribution to  dizziness if medication-related. Discussed testing for orthostatic hypotension to assess medication impact. - Continue current antihypertensive medication - Monitor blood pressure regularly  Right Shoulder Pain Chronic right shoulder pain for about a year, exacerbated by arm lifting and lying on the affected side. No trauma history. Differential includes rotator cuff pathology. Family history of rotator cuff issues. Discussed referral to orthopedics for evaluation and imaging. - Refer to orthopedics for evaluation and imaging - Advise continuation of Aleve  for pain management as needed        Follow up plan: Return if symptoms worsen or fail to improve.

## 2023-06-30 ENCOUNTER — Ambulatory Visit: Payer: Self-pay | Admitting: Nurse Practitioner

## 2023-06-30 DIAGNOSIS — D509 Iron deficiency anemia, unspecified: Secondary | ICD-10-CM

## 2023-06-30 LAB — CBC WITH DIFFERENTIAL/PLATELET
Absolute Lymphocytes: 1733 {cells}/uL (ref 850–3900)
Absolute Monocytes: 437 {cells}/uL (ref 200–950)
Basophils Absolute: 41 {cells}/uL (ref 0–200)
Basophils Relative: 0.9 %
Eosinophils Absolute: 288 {cells}/uL (ref 15–500)
Eosinophils Relative: 6.4 %
HCT: 33.8 % — ABNORMAL LOW (ref 35.0–45.0)
Hemoglobin: 10.2 g/dL — ABNORMAL LOW (ref 11.7–15.5)
MCH: 25.8 pg — ABNORMAL LOW (ref 27.0–33.0)
MCHC: 30.2 g/dL — ABNORMAL LOW (ref 32.0–36.0)
MCV: 85.4 fL (ref 80.0–100.0)
MPV: 10 fL (ref 7.5–12.5)
Monocytes Relative: 9.7 %
Neutro Abs: 2003 {cells}/uL (ref 1500–7800)
Neutrophils Relative %: 44.5 %
Platelets: 375 10*3/uL (ref 140–400)
RBC: 3.96 10*6/uL (ref 3.80–5.10)
RDW: 13.7 % (ref 11.0–15.0)
Total Lymphocyte: 38.5 %
WBC: 4.5 10*3/uL (ref 3.8–10.8)

## 2023-06-30 LAB — IRON,TIBC AND FERRITIN PANEL
%SAT: 13 % — ABNORMAL LOW (ref 16–45)
Ferritin: 9 ng/mL — ABNORMAL LOW (ref 16–232)
Iron: 51 ug/dL (ref 45–160)
TIBC: 407 ug/dL (ref 250–450)

## 2023-06-30 MED ORDER — IRON (FERROUS SULFATE) 325 (65 FE) MG PO TABS: 325.0000 mg | ORAL_TABLET | Freq: Every day | ORAL | 1 refills | Status: AC

## 2023-08-11 ENCOUNTER — Encounter: Payer: Self-pay | Admitting: Advanced Practice Midwife

## 2023-11-17 ENCOUNTER — Other Ambulatory Visit: Payer: Self-pay | Admitting: Nurse Practitioner

## 2023-11-17 DIAGNOSIS — Z1231 Encounter for screening mammogram for malignant neoplasm of breast: Secondary | ICD-10-CM

## 2023-11-23 ENCOUNTER — Encounter: Payer: Self-pay | Admitting: Nurse Practitioner

## 2023-11-23 ENCOUNTER — Ambulatory Visit: Admitting: Nurse Practitioner

## 2023-11-23 VITALS — BP 150/102 | HR 87 | Temp 97.9°F | Ht 65.0 in | Wt 187.0 lb

## 2023-11-23 DIAGNOSIS — E66811 Obesity, class 1: Secondary | ICD-10-CM

## 2023-11-23 DIAGNOSIS — E782 Mixed hyperlipidemia: Secondary | ICD-10-CM

## 2023-11-23 DIAGNOSIS — I10 Essential (primary) hypertension: Secondary | ICD-10-CM

## 2023-11-23 DIAGNOSIS — D509 Iron deficiency anemia, unspecified: Secondary | ICD-10-CM

## 2023-11-23 DIAGNOSIS — Z6831 Body mass index (BMI) 31.0-31.9, adult: Secondary | ICD-10-CM

## 2023-11-23 DIAGNOSIS — E1165 Type 2 diabetes mellitus with hyperglycemia: Secondary | ICD-10-CM | POA: Diagnosis not present

## 2023-11-23 DIAGNOSIS — Z23 Encounter for immunization: Secondary | ICD-10-CM

## 2023-11-23 DIAGNOSIS — E6609 Other obesity due to excess calories: Secondary | ICD-10-CM | POA: Insufficient documentation

## 2023-11-23 MED ORDER — ATORVASTATIN CALCIUM 20 MG PO TABS: 20.0000 mg | ORAL_TABLET | Freq: Every day | ORAL | 3 refills | Status: AC

## 2023-11-23 NOTE — Assessment & Plan Note (Signed)
 Discussed the importance of taking Metformin  500 mg daily an working on reducing sugar in diet. Checking Hemoglobin A1C today. Diabetic foot exam was normal. Plan to see patient back in 1 month to follow-up on medication adherence.

## 2023-11-23 NOTE — Progress Notes (Addendum)
 BP (!) 150/102   Pulse 87   Temp 97.9 F (36.6 C)   Ht 5' 5 (1.651 m)   Wt 187 lb (84.8 kg)   SpO2 97%   BMI 31.12 kg/m    Subjective:    Patient ID: [name], female    DOB: 24-Mar-1960, 63 y.o.   MRN: [id number]  HPI: [name] is a 63 y.o. female with a history of hypertension, type 2 diabetes, hyperlipidemia, and iron  deficiency anemia who presents today for a chronic medical management follow-up.  For her hypertension she takes lisinopril -hydrochlorothiazide  20-25 mg tablet about 3-4 times a week, but not daily. She did not take medication this morning. She does not check her blood pressure at home. She has been working on reducing salt in her diet.   She is taking her metformin  500 mg for diabetes 3-4 times a week when she takes her other medications. She reports she struggles to remember taking her medications.Hemoglobin A1C, 5 months ago was 6.8%. She does not check her blood sugar at home. She has been trying to manage her blood sugar with lifestyle modifications including walking daily. Reports some intermittent tingling in her feet but resolves quickly. Diabetic foot exam will be performed today along with checking hemoglobin A1C.   Hyperlipidemia is managed with atorvastatin 20 mg but is not consistent with taking this medication. She is also taking this medication 3-4 times a week when she remembers. She has been walking daily for exercise.   For iron  deficiency anemia she is taking Ferrous Sulfate  325 mg tabs 3-4 times a week. She denies feeling like she lacks energy throughout the day.           06/29/2023   10:04 AM 06/16/2023   10:56 AM 11/08/2022   10:07 AM  Depression screen PHQ 2/9  Decreased Interest 0 0 0  Down, Depressed, Hopeless 0 0 0  PHQ - 2 Score 0 0 0  Altered sleeping 0 0   Tired, decreased energy 0 0   Change in appetite 0 0   Feeling bad or failure about yourself  0 0   Trouble concentrating 0 0   Moving slowly or fidgety/restless  0 0   Suicidal thoughts 0 0   PHQ-9 Score 0 0   Difficult doing work/chores Not difficult at all Not difficult at all     Relevant past medical, surgical, family and social history reviewed and updated as indicated. Interim medical history since our last visit reviewed. Allergies and medications reviewed and updated.  Review of Systems Ten systems reviewed and is negative except as mentioned in HPI      Objective:     BP (!) 150/102   Pulse 87   Temp 97.9 F (36.6 C)   Ht 5' 5 (1.651 m)   Wt 187 lb (84.8 kg)   SpO2 97%   BMI 31.12 kg/m    Wt Readings from Last 3 Encounters:  11/23/23 187 lb (84.8 kg)  06/29/23 198 lb (89.8 kg)  06/16/23 194 lb 1.6 oz (88 kg)    Diabetic foot exam was performed with the following findings:   No deformities, ulcerations, or other skin breakdown Normal sensation of 10g monofilament Intact posterior tibialis and dorsalis pedis pulses     Physical Exam Constitutional:      Appearance: Normal appearance.  HENT:     Head: Normocephalic and atraumatic.  Cardiovascular:     Rate and Rhythm: Normal rate and regular rhythm.  Pulmonary:     Effort: Pulmonary effort is normal.     Breath sounds: Normal breath sounds.  Musculoskeletal:        General: Normal range of motion.     Cervical back: Normal range of motion and neck supple.  Skin:    General: Skin is warm and dry.  Neurological:     General: No focal deficit present.     Mental Status: She is alert and oriented to person, place, and time.  Psychiatric:        Mood and Affect: Mood normal.        Behavior: Behavior normal.        Thought Content: Thought content normal.        Judgment: Judgment normal.      Results for orders placed or performed in visit on 06/29/23  CBC with Differential/Platelet   Collection Time: 06/29/23 10:56 AM  Result Value Ref Range   WBC 4.5 3.8 - 10.8 Thousand/uL   RBC 3.96 3.80 - 5.10 Million/uL   Hemoglobin 10.2 (L) 11.7 - 15.5 g/dL   HCT  66.1 (L) 64.9 - 45.0 %   MCV 85.4 80.0 - 100.0 fL   MCH 25.8 (L) 27.0 - 33.0 pg   MCHC 30.2 (L) 32.0 - 36.0 g/dL   RDW 86.2 88.9 - 84.9 %   Platelets 375 140 - 400 Thousand/uL   MPV 10.0 7.5 - 12.5 fL   Neutro Abs 2,003 1,500 - 7,800 cells/uL   Absolute Lymphocytes 1,733 850 - 3,900 cells/uL   Absolute Monocytes 437 200 - 950 cells/uL   Eosinophils Absolute 288 15 - 500 cells/uL   Basophils Absolute 41 0 - 200 cells/uL   Neutrophils Relative % 44.5 %   Total Lymphocyte 38.5 %   Monocytes Relative 9.7 %   Eosinophils Relative 6.4 %   Basophils Relative 0.9 %  Iron , TIBC and Ferritin Panel   Collection Time: 06/29/23 10:56 AM  Result Value Ref Range   Iron  51 45 - 160 mcg/dL   TIBC 592 749 - 549 mcg/dL (calc)   %SAT 13 (L) 16 - 45 % (calc)   Ferritin 9 (L) 16 - 232 ng/mL          Assessment & Plan:   Problem List Items Addressed This Visit       Cardiovascular and Mediastinum   Benign essential HTN - Primary   Blood pressure elevated today 158/108. She is not consistent with taking blood pressure. Discussed the importance of taking medication daily. Take lisinopril -hydrochlorothiazide  20-25 mg daily and begin recording blood pressure at home. Try to get 150 min of cardiovascular exercise a week.       Relevant Medications   atorvastatin (LIPITOR) 20 MG tablet     Endocrine   Type 2 diabetes mellitus with hyperglycemia, without long-term current use of insulin (HCC)   Discussed the importance of taking Metformin  500 mg daily an working on reducing sugar in diet. Checking Hemoglobin A1C today. Diabetic foot exam was normal. Plan to see patient back in 1 month to follow-up on medication adherence.       Relevant Medications   atorvastatin (LIPITOR) 20 MG tablet   Other Relevant Orders   Ambulatory referral to Ophthalmology   HM Diabetes Foot Exam (Completed)   Comprehensive metabolic panel with GFR   Hemoglobin A1c   Microalbumin / creatinine urine ratio     Other    Mixed hyperlipidemia   Discussed the importance with taking atorvastatin  20 mg daily. Risks of not taking medication were discussed with patient. Also educated on the importance of reducing saturated fats in diet and getting at least 150 min of cardiovascular exercise a week. Patient on board with setting reminders to take medication daily.       Relevant Medications   atorvastatin (LIPITOR) 20 MG tablet   Other Relevant Orders   Lipid panel   Class 1 obesity due to excess calories with serious comorbidity and body mass index (BMI) of 31.0 to 31.9 in adult   Wt Readings from Last 3 Encounters:  11/23/23 187 lb (84.8 kg)  06/29/23 198 lb (89.8 kg)  06/16/23 194 lb 1.6 oz (88 kg)   Body mass index is 31.12 kg/m.  Flowsheet Row Office Visit from 11/23/2023 in [facility]  1 40 inches   - Encourage continuation of lifestyle modifications, including dietary management and regular exercise. -continue to increase physical activity, getting at least 150 min of physical activity a week.  Work on including runner, broadcasting/film/video 2 days a week.  - continue eating at a calorie deficit 1600-1700 cal a day, eating a well balanced diet with whole foods, avoiding processed foods.   Patient is motivated to continue working on lifestyle modification.        Iron  deficiency anemia   Relevant Orders   CBC with Differential/Platelet     Discussed the strong importance of taking medications daily as prescribed to prevent future cardiovascular events along with other co-morbidities. Discussed the risks of not taking medications. Advised patient to make reminders on sticky notes or set alarms on her phone to take medication. She is on board with beginning to take medication daily and follow-up in one month to see how she is doing. Checking blood work today along with microalbumin urine. Patient will be notified of results via My Chart. Continue walking daily and work on reducing salt,  saturated fats, and sugar in diet.          Follow up plan: Return in about 4 weeks (around 12/21/2023) for follow up.   I have reviewed this encounter including the documentation in this note and/or discussed this patient with the provider, Aislinn Womack, SNP, I am certifying that I agree with the content of this note as supervising/preceptor nurse practitioner.  Mliss Spray, FNP-C [facility] 11/23/2023, 11:43 AM   Had a long discussion with patient regarding the risks of not being consistent with her treatment for diabetes, hypertension and hyperlipidemia. Including heart attack, stroke and death.  Patient verbalized understanding.

## 2023-11-23 NOTE — Assessment & Plan Note (Signed)
 Wt Readings from Last 3 Encounters:  11/23/23 187 lb (84.8 kg)  06/29/23 198 lb (89.8 kg)  06/16/23 194 lb 1.6 oz (88 kg)   Body mass index is 31.12 kg/m.  Flowsheet Row Office Visit from 11/23/2023 in [facility]  1 40 inches   - Encourage continuation of lifestyle modifications, including dietary management and regular exercise. -continue to increase physical activity, getting at least 150 min of physical activity a week.  Work on including runner, broadcasting/film/video 2 days a week.  - continue eating at a calorie deficit 1600-1700 cal a day, eating a well balanced diet with whole foods, avoiding processed foods.   Patient is motivated to continue working on lifestyle modification.

## 2023-11-23 NOTE — Assessment & Plan Note (Addendum)
 Blood pressure elevated today 158/108. She is not consistent with taking blood pressure. Discussed the importance of taking medication daily. Take lisinopril -hydrochlorothiazide  20-25 mg daily and begin recording blood pressure at home. Try to get 150 min of cardiovascular exercise a week.

## 2023-11-23 NOTE — Assessment & Plan Note (Signed)
 Discussed the importance with taking atorvastatin 20 mg daily. Risks of not taking medication were discussed with patient. Also educated on the importance of reducing saturated fats in diet and getting at least 150 min of cardiovascular exercise a week. Patient on board with setting reminders to take medication daily.

## 2023-11-24 ENCOUNTER — Ambulatory Visit: Payer: Self-pay | Admitting: Nurse Practitioner

## 2023-11-24 LAB — HEMOGLOBIN A1C
Hgb A1c MFr Bld: 6.3 % — ABNORMAL HIGH (ref <5.7)
Mean Plasma Glucose: 134 mg/dL
eAG (mmol/L): 7.4 mmol/L

## 2023-11-24 LAB — LIPID PANEL
Cholesterol: 227 mg/dL — ABNORMAL HIGH (ref <200)
HDL: 67 mg/dL (ref >=50)
LDL Cholesterol (Calc): 139 mg/dL — ABNORMAL HIGH
Non-HDL Cholesterol (Calc): 160 mg/dL — ABNORMAL HIGH (ref <130)
Total CHOL/HDL Ratio: 3.4 (calc) (ref <5.0)
Triglycerides: 100 mg/dL (ref <150)

## 2023-11-24 LAB — MICROALBUMIN / CREATININE URINE RATIO
Creatinine, Urine: 51 mg/dL (ref 20–275)
Microalb Creat Ratio: 6 mg/g{creat} (ref <30)
Microalb, Ur: 0.3 mg/dL

## 2023-11-24 LAB — COMPREHENSIVE METABOLIC PANEL WITH GFR
AG Ratio: 1.4 (calc) (ref 1.0–2.5)
ALT: 10 U/L (ref 6–29)
AST: 14 U/L (ref 10–35)
Albumin: 4.5 g/dL (ref 3.6–5.1)
Alkaline phosphatase (APISO): 55 U/L (ref 37–153)
BUN: 14 mg/dL (ref 7–25)
CO2: 31 mmol/L (ref 20–32)
Calcium: 10 mg/dL (ref 8.6–10.4)
Chloride: 101 mmol/L (ref 98–110)
Creat: 0.75 mg/dL (ref 0.50–1.05)
Globulin: 3.2 g/dL (ref 1.9–3.7)
Glucose, Bld: 95 mg/dL (ref 65–99)
Potassium: 4.6 mmol/L (ref 3.5–5.3)
Sodium: 139 mmol/L (ref 135–146)
Total Bilirubin: 0.4 mg/dL (ref 0.2–1.2)
Total Protein: 7.7 g/dL (ref 6.1–8.1)
eGFR: 91 mL/min/1.73m2 (ref >=60)

## 2023-11-24 LAB — CBC WITH DIFFERENTIAL/PLATELET
Absolute Lymphocytes: 2011 {cells}/uL (ref 850–3900)
Absolute Monocytes: 453 {cells}/uL (ref 200–950)
Basophils Absolute: 31 {cells}/uL (ref 0–200)
Basophils Relative: 0.7 %
Eosinophils Absolute: 189 {cells}/uL (ref 15–500)
Eosinophils Relative: 4.3 %
HCT: 38.1 % (ref 35.0–45.0)
Hemoglobin: 12.2 g/dL (ref 11.7–15.5)
MCH: 28.1 pg (ref 27.0–33.0)
MCHC: 32 g/dL (ref 32.0–36.0)
MCV: 87.8 fL (ref 80.0–100.0)
MPV: 10.1 fL (ref 7.5–12.5)
Monocytes Relative: 10.3 %
Neutro Abs: 1716 {cells}/uL (ref 1500–7800)
Neutrophils Relative %: 39 %
Platelets: 369 Thousand/uL (ref 140–400)
RBC: 4.34 Million/uL (ref 3.80–5.10)
RDW: 13.1 % (ref 11.0–15.0)
Total Lymphocyte: 45.7 %
WBC: 4.4 Thousand/uL (ref 3.8–10.8)

## 2023-12-20 ENCOUNTER — Ambulatory Visit
Admission: RE | Admit: 2023-12-20 | Discharge: 2023-12-20 | Disposition: A | Discharge status: Home / Self Care | Source: Ambulatory Visit | Attending: Nurse Practitioner | Admitting: Nurse Practitioner

## 2023-12-20 DIAGNOSIS — Z1231 Encounter for screening mammogram for malignant neoplasm of breast: Secondary | ICD-10-CM

## 2023-12-25 ENCOUNTER — Ambulatory Visit: Admitting: Nurse Practitioner

## 2023-12-25 VITALS — BP 121/72 | HR 95 | Resp 16 | Ht 65.0 in | Wt 187.0 lb

## 2023-12-25 DIAGNOSIS — I10 Essential (primary) hypertension: Secondary | ICD-10-CM

## 2023-12-25 NOTE — Progress Notes (Signed)
 BP 121/72 (BP Location: Left Arm, Patient Position: Sitting, Cuff Size: Normal)   Pulse 95   Resp 16   Ht 5' 5 (1.651 m)   Wt 187 lb (84.8 kg)   SpO2 99%   BMI 31.12 kg/m    Subjective:    Patient ID: [name], female    DOB: 05/01/1960, 63 y.o.   MRN: [id number]  HPI: [name] is a 63 y.o. female  Chief Complaint  Patient presents with   Follow-up    4 wk f/u with no other concerns   Discussed the use of AI scribe software for clinical note transcription with the patient, who gave verbal consent to proceed.  History of Present Illness [name] is a 63 year old female with hypertension who presents for a blood pressure recheck.  Hypertension management - Blood pressure recheck following previous elevated measurement of 150/102 mmHg on November 23, 2023 - Previously inconsistent with antihypertensive medication adherence - Currently taking lisinopril -hydrochlorothiazide  20-25 mg daily - Blood pressure today is 121/72 mmHg - No side effects from current antihypertensive regimen - No symptoms of chest pain, shortness of breath, or palpitations  General constitutional symptoms - Feels well overall - No new issues or concerns reported during review of systems         06/29/2023   10:04 AM 06/16/2023   10:56 AM 11/08/2022   10:07 AM  Depression screen PHQ 2/9  Decreased Interest 0 0 0  Down, Depressed, Hopeless 0 0 0  PHQ - 2 Score 0 0 0  Altered sleeping 0 0   Tired, decreased energy 0 0   Change in appetite 0 0   Feeling bad or failure about yourself  0 0   Trouble concentrating 0 0   Moving slowly or fidgety/restless 0 0   Suicidal thoughts 0 0   PHQ-9 Score 0  0    Difficult doing work/chores Not difficult at all Not difficult at all      Data saved with a previous flowsheet row definition    Relevant past medical, surgical, family and social history reviewed and updated as indicated. Interim medical history since our last visit  reviewed. Allergies and medications reviewed and updated.  Review of Systems  Constitutional: Negative for fever or weight change.  Respiratory: Negative for cough and shortness of breath.   Cardiovascular: Negative for chest pain or palpitations.  Gastrointestinal: Negative for abdominal pain, no bowel changes.  Musculoskeletal: Negative for gait problem or joint swelling.  Skin: Negative for rash.  Neurological: Negative for dizziness or headache.  No other specific complaints in a complete review of systems (except as listed in HPI above).      Objective:      BP 121/72 (BP Location: Left Arm, Patient Position: Sitting, Cuff Size: Normal)   Pulse 95   Resp 16   Ht 5' 5 (1.651 m)   Wt 187 lb (84.8 kg)   SpO2 99%   BMI 31.12 kg/m    Wt Readings from Last 3 Encounters:  12/25/23 187 lb (84.8 kg)  11/23/23 187 lb (84.8 kg)  06/29/23 198 lb (89.8 kg)    Physical Exam VITALS: BP- 121/72 GENERAL: Alert, cooperative, well developed, no acute distress HEENT: Normocephalic, normal oropharynx, moist mucous membranes CHEST: Clear to auscultation bilaterally, no wheezes, rhonchi, or crackles CARDIOVASCULAR: Normal heart rate and rhythm, S1 and S2 normal without murmurs ABDOMEN: Soft, non-tender, non-distended, without organomegaly, normal bowel sounds EXTREMITIES: No cyanosis or  edema NEUROLOGICAL: Cranial nerves grossly intact, moves all extremities without gross motor or sensory deficit  Results for orders placed or performed in visit on 11/23/23  CBC with Differential/Platelet   Collection Time: 11/23/23 11:23 AM  Result Value Ref Range   WBC 4.4 3.8 - 10.8 Thousand/uL   RBC 4.34 3.80 - 5.10 Million/uL   Hemoglobin 12.2 11.7 - 15.5 g/dL   HCT 61.8 64.9 - 54.9 %   MCV 87.8 80.0 - 100.0 fL   MCH 28.1 27.0 - 33.0 pg   MCHC 32.0 32.0 - 36.0 g/dL   RDW 86.8 88.9 - 84.9 %   Platelets 369 140 - 400 Thousand/uL   MPV 10.1 7.5 - 12.5 fL   Neutro Abs 1,716 1,500 - 7,800  cells/uL   Absolute Lymphocytes 2,011 850 - 3,900 cells/uL   Absolute Monocytes 453 200 - 950 cells/uL   Eosinophils Absolute 189 15 - 500 cells/uL   Basophils Absolute 31 0 - 200 cells/uL   Neutrophils Relative % 39 %   Total Lymphocyte 45.7 %   Monocytes Relative 10.3 %   Eosinophils Relative 4.3 %   Basophils Relative 0.7 %  Comprehensive metabolic panel with GFR   Collection Time: 11/23/23 11:23 AM  Result Value Ref Range   Glucose, Bld 95 65 - 99 mg/dL   BUN 14 7 - 25 mg/dL   Creat 9.24 9.49 - 8.94 mg/dL   eGFR 91 > OR = 60 fO/fpw/8.26f7   BUN/Creatinine Ratio SEE NOTE: 6 - 22 (calc)   Sodium 139 135 - 146 mmol/L   Potassium 4.6 3.5 - 5.3 mmol/L   Chloride 101 98 - 110 mmol/L   CO2 31 20 - 32 mmol/L   Calcium  10.0 8.6 - 10.4 mg/dL   Total Protein 7.7 6.1 - 8.1 g/dL   Albumin 4.5 3.6 - 5.1 g/dL   Globulin 3.2 1.9 - 3.7 g/dL (calc)   AG Ratio 1.4 1.0 - 2.5 (calc)   Total Bilirubin 0.4 0.2 - 1.2 mg/dL   Alkaline phosphatase (APISO) 55 37 - 153 U/L   AST 14 10 - 35 U/L   ALT 10 6 - 29 U/L  Lipid panel   Collection Time: 11/23/23 11:23 AM  Result Value Ref Range   Cholesterol 227 (H) <200 mg/dL   HDL 67 > OR = 50 mg/dL   Triglycerides 899 <849 mg/dL   LDL Cholesterol (Calc) 139 (H) mg/dL (calc)   Total CHOL/HDL Ratio 3.4 <5.0 (calc)   Non-HDL Cholesterol (Calc) 160 (H) <130 mg/dL (calc)  Hemoglobin J8r   Collection Time: 11/23/23 11:23 AM  Result Value Ref Range   Hgb A1c MFr Bld 6.3 (H) <5.7 %   Mean Plasma Glucose 134 mg/dL   eAG (mmol/L) 7.4 mmol/L  Microalbumin / creatinine urine ratio   Collection Time: 11/23/23 11:23 AM  Result Value Ref Range   Creatinine, Urine 51 20 - 275 mg/dL   Microalb, Ur 0.3 mg/dL   Microalb Creat Ratio 6 <30 mg/g creat          Assessment & Plan:   Problem List Items Addressed This Visit       Cardiovascular and Mediastinum   Benign essential HTN - Primary     Assessment and Plan Assessment & Plan Essential  hypertension Blood pressure improved from 150/102 mmHg on October 30th, 2025, to 121/72 mmHg today, indicating effective management with current medication regimen. - Continue lisinopril -hydrochlorothiazide  20-25 mg daily. - Scheduled follow-up appointment in six months.  BP Readings from Last 3 Encounters:  12/25/23 121/72  11/23/23 (!) 150/102  06/29/23 (!) 164/92       Follow up plan: Return in about 6 months (around 06/24/2024) for follow up.

## 2024-06-24 ENCOUNTER — Ambulatory Visit: Admitting: Nurse Practitioner
